# Patient Record
Sex: Female | Born: 1935 | Race: Black or African American | Hispanic: No | Smoking: Never smoker
Health system: Southern US, Community
[De-identification: ages and names within clinical notes are randomized; demographics above are authoritative.]

## PROBLEM LIST (undated history)

## (undated) DIAGNOSIS — I10 Essential (primary) hypertension: Secondary | ICD-10-CM

## (undated) DIAGNOSIS — E785 Hyperlipidemia, unspecified: Secondary | ICD-10-CM

## (undated) DIAGNOSIS — N183 Chronic kidney disease, stage 3 unspecified: Secondary | ICD-10-CM

## (undated) DIAGNOSIS — I2510 Atherosclerotic heart disease of native coronary artery without angina pectoris: Secondary | ICD-10-CM

## (undated) DIAGNOSIS — D649 Anemia, unspecified: Secondary | ICD-10-CM

## (undated) DIAGNOSIS — K219 Gastro-esophageal reflux disease without esophagitis: Secondary | ICD-10-CM

## (undated) DIAGNOSIS — Z9861 Coronary angioplasty status: Secondary | ICD-10-CM

## (undated) DIAGNOSIS — I255 Ischemic cardiomyopathy: Secondary | ICD-10-CM

## (undated) DIAGNOSIS — B029 Zoster without complications: Secondary | ICD-10-CM

## (undated) DIAGNOSIS — E1129 Type 2 diabetes mellitus with other diabetic kidney complication: Secondary | ICD-10-CM

## (undated) DIAGNOSIS — I214 Non-ST elevation (NSTEMI) myocardial infarction: Secondary | ICD-10-CM

## (undated) DIAGNOSIS — Z91041 Radiographic dye allergy status: Secondary | ICD-10-CM

## (undated) DIAGNOSIS — C259 Malignant neoplasm of pancreas, unspecified: Secondary | ICD-10-CM

## (undated) DIAGNOSIS — E669 Obesity, unspecified: Secondary | ICD-10-CM

## (undated) HISTORY — PX: ABDOMINAL HYSTERECTOMY: SHX81

## (undated) HISTORY — PX: CARDIAC CATHETERIZATION: SHX172

## (undated) HISTORY — DX: Malignant neoplasm of pancreas, unspecified: C25.9

## (undated) HISTORY — PX: CHOLECYSTECTOMY: SHX55

---

## 2014-01-06 DIAGNOSIS — I214 Non-ST elevation (NSTEMI) myocardial infarction: Secondary | ICD-10-CM

## 2014-01-06 DIAGNOSIS — I255 Ischemic cardiomyopathy: Secondary | ICD-10-CM

## 2014-01-06 DIAGNOSIS — Z91041 Radiographic dye allergy status: Secondary | ICD-10-CM

## 2014-01-06 HISTORY — DX: Ischemic cardiomyopathy: I25.5

## 2014-01-06 HISTORY — DX: Radiographic dye allergy status: Z91.041

## 2014-01-06 HISTORY — DX: Non-ST elevation (NSTEMI) myocardial infarction: I21.4

## 2014-01-15 DIAGNOSIS — N179 Acute kidney failure, unspecified: Secondary | ICD-10-CM | POA: Insufficient documentation

## 2014-01-15 DIAGNOSIS — I2510 Atherosclerotic heart disease of native coronary artery without angina pectoris: Secondary | ICD-10-CM | POA: Insufficient documentation

## 2014-01-15 DIAGNOSIS — K219 Gastro-esophageal reflux disease without esophagitis: Secondary | ICD-10-CM | POA: Insufficient documentation

## 2014-01-15 DIAGNOSIS — E785 Hyperlipidemia, unspecified: Secondary | ICD-10-CM | POA: Insufficient documentation

## 2014-09-07 ENCOUNTER — Inpatient Hospital Stay (HOSPITAL_COMMUNITY)
Admission: EM | Admit: 2014-09-07 | Discharge: 2014-09-08 | DRG: 812 | Disposition: A | Discharge status: Home / Self Care | Payer: Medicare Other | Attending: Internal Medicine | Admitting: Internal Medicine

## 2014-09-07 ENCOUNTER — Encounter (HOSPITAL_COMMUNITY): Payer: Self-pay

## 2014-09-07 DIAGNOSIS — I252 Old myocardial infarction: Secondary | ICD-10-CM | POA: Diagnosis not present

## 2014-09-07 DIAGNOSIS — K219 Gastro-esophageal reflux disease without esophagitis: Secondary | ICD-10-CM | POA: Diagnosis present

## 2014-09-07 DIAGNOSIS — E669 Obesity, unspecified: Secondary | ICD-10-CM | POA: Diagnosis present

## 2014-09-07 DIAGNOSIS — Z833 Family history of diabetes mellitus: Secondary | ICD-10-CM

## 2014-09-07 DIAGNOSIS — Z9861 Coronary angioplasty status: Secondary | ICD-10-CM

## 2014-09-07 DIAGNOSIS — K922 Gastrointestinal hemorrhage, unspecified: Secondary | ICD-10-CM | POA: Diagnosis present

## 2014-09-07 DIAGNOSIS — Z955 Presence of coronary angioplasty implant and graft: Secondary | ICD-10-CM

## 2014-09-07 DIAGNOSIS — I129 Hypertensive chronic kidney disease with stage 1 through stage 4 chronic kidney disease, or unspecified chronic kidney disease: Secondary | ICD-10-CM | POA: Diagnosis present

## 2014-09-07 DIAGNOSIS — Z66 Do not resuscitate: Secondary | ICD-10-CM | POA: Diagnosis present

## 2014-09-07 DIAGNOSIS — D62 Acute posthemorrhagic anemia: Secondary | ICD-10-CM | POA: Diagnosis present

## 2014-09-07 DIAGNOSIS — Z794 Long term (current) use of insulin: Secondary | ICD-10-CM | POA: Diagnosis not present

## 2014-09-07 DIAGNOSIS — N183 Chronic kidney disease, stage 3 unspecified: Secondary | ICD-10-CM | POA: Diagnosis present

## 2014-09-07 DIAGNOSIS — N189 Chronic kidney disease, unspecified: Secondary | ICD-10-CM | POA: Diagnosis not present

## 2014-09-07 DIAGNOSIS — I2510 Atherosclerotic heart disease of native coronary artery without angina pectoris: Secondary | ICD-10-CM | POA: Diagnosis present

## 2014-09-07 DIAGNOSIS — Z91041 Radiographic dye allergy status: Secondary | ICD-10-CM | POA: Diagnosis present

## 2014-09-07 DIAGNOSIS — Z7982 Long term (current) use of aspirin: Secondary | ICD-10-CM | POA: Diagnosis not present

## 2014-09-07 DIAGNOSIS — I10 Essential (primary) hypertension: Secondary | ICD-10-CM | POA: Diagnosis not present

## 2014-09-07 DIAGNOSIS — E118 Type 2 diabetes mellitus with unspecified complications: Secondary | ICD-10-CM

## 2014-09-07 DIAGNOSIS — E785 Hyperlipidemia, unspecified: Secondary | ICD-10-CM | POA: Diagnosis present

## 2014-09-07 DIAGNOSIS — D6489 Other specified anemias: Secondary | ICD-10-CM | POA: Diagnosis not present

## 2014-09-07 DIAGNOSIS — R195 Other fecal abnormalities: Secondary | ICD-10-CM | POA: Diagnosis present

## 2014-09-07 DIAGNOSIS — R531 Weakness: Secondary | ICD-10-CM | POA: Diagnosis present

## 2014-09-07 DIAGNOSIS — E1129 Type 2 diabetes mellitus with other diabetic kidney complication: Secondary | ICD-10-CM | POA: Diagnosis present

## 2014-09-07 DIAGNOSIS — Z7902 Long term (current) use of antithrombotics/antiplatelets: Secondary | ICD-10-CM | POA: Diagnosis not present

## 2014-09-07 DIAGNOSIS — D509 Iron deficiency anemia, unspecified: Secondary | ICD-10-CM | POA: Diagnosis present

## 2014-09-07 DIAGNOSIS — D649 Anemia, unspecified: Secondary | ICD-10-CM | POA: Insufficient documentation

## 2014-09-07 DIAGNOSIS — E1122 Type 2 diabetes mellitus with diabetic chronic kidney disease: Secondary | ICD-10-CM | POA: Diagnosis present

## 2014-09-07 DIAGNOSIS — Z6837 Body mass index (BMI) 37.0-37.9, adult: Secondary | ICD-10-CM | POA: Diagnosis not present

## 2014-09-07 DIAGNOSIS — Z823 Family history of stroke: Secondary | ICD-10-CM | POA: Diagnosis not present

## 2014-09-07 HISTORY — DX: Essential (primary) hypertension: I10

## 2014-09-07 HISTORY — DX: Non-ST elevation (NSTEMI) myocardial infarction: I21.4

## 2014-09-07 HISTORY — DX: Atherosclerotic heart disease of native coronary artery without angina pectoris: I25.10

## 2014-09-07 HISTORY — DX: Hyperlipidemia, unspecified: E78.5

## 2014-09-07 HISTORY — DX: Obesity, unspecified: E66.9

## 2014-09-07 HISTORY — DX: Chronic kidney disease, stage 3 (moderate): N18.3

## 2014-09-07 HISTORY — DX: Coronary angioplasty status: Z98.61

## 2014-09-07 HISTORY — DX: Gastro-esophageal reflux disease without esophagitis: K21.9

## 2014-09-07 HISTORY — DX: Zoster without complications: B02.9

## 2014-09-07 HISTORY — DX: Anemia, unspecified: D64.9

## 2014-09-07 HISTORY — DX: Chronic kidney disease, stage 3 unspecified: N18.30

## 2014-09-07 HISTORY — DX: Radiographic dye allergy status: Z91.041

## 2014-09-07 HISTORY — DX: Ischemic cardiomyopathy: I25.5

## 2014-09-07 HISTORY — DX: Type 2 diabetes mellitus with other diabetic kidney complication: E11.29

## 2014-09-07 LAB — CBC WITH DIFFERENTIAL/PLATELET
BASOS ABS: 0.1 10*3/uL (ref 0.0–0.1)
Basophils Relative: 1 % (ref 0–1)
EOS PCT: 6 % — AB (ref 0–5)
Eosinophils Absolute: 0.3 10*3/uL (ref 0.0–0.7)
HCT: 23.4 % — ABNORMAL LOW (ref 36.0–46.0)
HEMOGLOBIN: 7.3 g/dL — AB (ref 12.0–15.0)
Lymphocytes Relative: 17 % (ref 12–46)
Lymphs Abs: 1 10*3/uL (ref 0.7–4.0)
MCH: 27 pg (ref 26.0–34.0)
MCHC: 31.2 g/dL (ref 30.0–36.0)
MCV: 86.7 fL (ref 78.0–100.0)
MONOS PCT: 6 % (ref 3–12)
Monocytes Absolute: 0.4 10*3/uL (ref 0.1–1.0)
Neutro Abs: 4 10*3/uL (ref 1.7–7.7)
Neutrophils Relative %: 70 % (ref 43–77)
PLATELETS: 222 10*3/uL (ref 150–400)
RBC: 2.7 MIL/uL — ABNORMAL LOW (ref 3.87–5.11)
RDW: 15.1 % (ref 11.5–15.5)
WBC: 5.7 10*3/uL (ref 4.0–10.5)

## 2014-09-07 LAB — BASIC METABOLIC PANEL
Anion gap: 7 (ref 5–15)
BUN: 28 mg/dL — ABNORMAL HIGH (ref 6–20)
CO2: 25 mmol/L (ref 22–32)
Calcium: 8.4 mg/dL — ABNORMAL LOW (ref 8.9–10.3)
Chloride: 105 mmol/L (ref 101–111)
Creatinine, Ser: 1.16 mg/dL — ABNORMAL HIGH (ref 0.44–1.00)
GFR calc Af Amer: 50 mL/min — ABNORMAL LOW (ref >60)
GFR calc non Af Amer: 43 mL/min — ABNORMAL LOW (ref >60)
Glucose, Bld: 162 mg/dL — ABNORMAL HIGH (ref 65–99)
Potassium: 3.9 mmol/L (ref 3.5–5.1)
SODIUM: 137 mmol/L (ref 135–145)

## 2014-09-07 LAB — IRON AND TIBC
IRON: 35 ug/dL (ref 28–170)
SATURATION RATIOS: 7 % — AB (ref 10.4–31.8)
TIBC: 472 ug/dL — AB (ref 250–450)
UIBC: 437 ug/dL

## 2014-09-07 LAB — PREPARE RBC (CROSSMATCH)

## 2014-09-07 LAB — GLUCOSE, CAPILLARY
Glucose-Capillary: 49 mg/dL — ABNORMAL LOW (ref 65–99)
Glucose-Capillary: 79 mg/dL (ref 65–99)
Glucose-Capillary: 184 mg/dL — ABNORMAL HIGH (ref 65–99)

## 2014-09-07 LAB — ABO/RH: ABO/RH(D): A POS

## 2014-09-07 LAB — VITAMIN B12: VITAMIN B 12: 540 pg/mL (ref 180–914)

## 2014-09-07 LAB — RETICULOCYTES
RBC.: 2.73 MIL/uL — AB (ref 3.87–5.11)
Retic Count, Absolute: 57.3 10*3/uL (ref 19.0–186.0)
Retic Ct Pct: 2.1 % (ref 0.4–3.1)

## 2014-09-07 LAB — PROTIME-INR
INR: 1.07 (ref 0.00–1.49)
Prothrombin Time: 14.1 seconds (ref 11.6–15.2)

## 2014-09-07 LAB — OCCULT BLOOD, POC DEVICE: Fecal Occult Bld: POSITIVE — AB

## 2014-09-07 LAB — TSH: TSH: 1.586 u[IU]/mL (ref 0.350–4.500)

## 2014-09-07 LAB — FERRITIN: FERRITIN: 10 ng/mL — AB (ref 11–307)

## 2014-09-07 MED ORDER — MECLIZINE HCL 12.5 MG PO TABS: 25.0000 mg | ORAL_TABLET | Freq: Three times a day (TID) | ORAL | Status: DC | PRN

## 2014-09-07 MED ORDER — INSULIN DETEMIR 100 UNIT/ML ~~LOC~~ SOLN
22.0000 [IU] | Freq: Every day | SUBCUTANEOUS | Status: DC
  Administered 2014-09-08: 22 [IU] via SUBCUTANEOUS
  Filled 2014-09-07 (×3): qty 0.22

## 2014-09-07 MED ORDER — SODIUM CHLORIDE 0.9 % IV SOLN
Freq: Once | INTRAVENOUS | Status: AC
  Administered 2014-09-07: 1 mL via INTRAVENOUS

## 2014-09-07 MED ORDER — ACETAMINOPHEN 325 MG PO TABS: 650.0000 mg | ORAL_TABLET | Freq: Four times a day (QID) | ORAL | Status: DC | PRN

## 2014-09-07 MED ORDER — ASPIRIN EC 81 MG PO TBEC
81.0000 mg | DELAYED_RELEASE_TABLET | Freq: Every day | ORAL | Status: DC
  Administered 2014-09-08: 81 mg via ORAL
  Filled 2014-09-07: qty 1

## 2014-09-07 MED ORDER — ACETAMINOPHEN 650 MG RE SUPP: 650.0000 mg | Freq: Four times a day (QID) | RECTAL | Status: DC | PRN

## 2014-09-07 MED ORDER — SODIUM CHLORIDE 0.9 % IJ SOLN
3.0000 mL | Freq: Two times a day (BID) | INTRAMUSCULAR | Status: DC
  Administered 2014-09-07 – 2014-09-08 (×2): 3 mL via INTRAVENOUS

## 2014-09-07 MED ORDER — INSULIN ASPART 100 UNIT/ML ~~LOC~~ SOLN
0.0000 [IU] | Freq: Three times a day (TID) | SUBCUTANEOUS | Status: DC
Start: 2014-09-07 — End: 2014-09-08
  Administered 2014-09-08: 5 [IU] via SUBCUTANEOUS

## 2014-09-07 MED ORDER — SODIUM CHLORIDE 0.9 % IV SOLN
INTRAVENOUS | Status: DC
  Administered 2014-09-07: 16:00:00 via INTRAVENOUS

## 2014-09-07 MED ORDER — ONDANSETRON HCL 4 MG/2ML IJ SOLN: 4.0000 mg | Freq: Four times a day (QID) | INTRAMUSCULAR | Status: DC | PRN

## 2014-09-07 MED ORDER — PANTOPRAZOLE SODIUM 40 MG PO TBEC
40.0000 mg | DELAYED_RELEASE_TABLET | Freq: Two times a day (BID) | ORAL | Status: DC
  Administered 2014-09-08 (×2): 40 mg via ORAL
  Filled 2014-09-07 (×2): qty 1

## 2014-09-07 MED ORDER — TICAGRELOR 90 MG PO TABS
90.0000 mg | ORAL_TABLET | Freq: Two times a day (BID) | ORAL | Status: DC
  Administered 2014-09-08 (×2): 90 mg via ORAL
  Filled 2014-09-07 (×4): qty 1

## 2014-09-07 MED ORDER — INSULIN ASPART 100 UNIT/ML ~~LOC~~ SOLN: 0.0000 [IU] | Freq: Every day | SUBCUTANEOUS | Status: DC

## 2014-09-07 MED ORDER — GABAPENTIN 300 MG PO CAPS: 300.0000 mg | ORAL_CAPSULE | Freq: Every evening | ORAL | Status: DC

## 2014-09-07 MED ORDER — METOPROLOL SUCCINATE ER 25 MG PO TB24
25.0000 mg | ORAL_TABLET | Freq: Every day | ORAL | Status: DC
  Administered 2014-09-08: 25 mg via ORAL
  Filled 2014-09-07: qty 1

## 2014-09-07 MED ORDER — INSULIN DETEMIR 100 UNIT/ML FLEXPEN: 22.0000 [IU] | PEN_INJECTOR | Freq: Every day | SUBCUTANEOUS | Status: DC

## 2014-09-07 MED ORDER — ONDANSETRON HCL 4 MG PO TABS: 4.0000 mg | ORAL_TABLET | Freq: Four times a day (QID) | ORAL | Status: DC | PRN

## 2014-09-07 NOTE — H&P (Signed)
[facility] History and Physical  Inioluwa Boulay ENI:[id number] DOB: Aug 03, 1931 DOA: 09/07/2014  Referring physician: Dr. Jeneen Rinks ER PCP: No primary care provider on file.   Chief Complaint: Weakness  HPI: Kelsey Palmer is a 81 y.o. female  Patient had routine blood work done on 7/29.  Today she was called by her Dr. office and oold to come to the ED for low hemoglobin. Patient is an 81 y/o female who has been feeling weak and tired.  This has been ongoing for about a week or a week and half.  Pt denies trouble breathing unless she is in the heat.  Denies dizziness, CP, v/n, dysuria, melena or hematochezia, abdominal pain.  States BM are fairly normal.  Previous MI in December 2015. Denies use of NSAIDS. Denies tobacco use, ETOH use.    Review of Systems:  Constitutional:  No weight loss, night sweats, Fevers, chills, fatigue.  HEENT:  No headaches, Difficulty swallowing,Tooth/dental problems,Sore throat,  No sneezing, itching, ear ache, nasal congestion, post nasal drip,  Cardio-vascular:  No chest pain, Orthopnea, PND, swelling in lower extremities, anasarca, dizziness, palpitations  GI:  No heartburn, indigestion, abdominal pain, nausea, vomiting, diarrhea, change in bowel habits, loss of appetite  Resp:  No shortness of breath with exertion or at rest. No excess mucus, no productive cough, No non-productive cough, No coughing up of blood.No change in color of mucus.No wheezing.No chest wall deformity  Skin:  no rash or lesions.  GU:  no dysuria, change in color of urine, no urgency or frequency. No flank pain.  Musculoskeletal:  No joint pain or swelling. No decreased range of motion. No back pain.  Psych:  No change in mood or affect. No depression or anxiety. No memory loss.   Past Medical History  Diagnosis Date  . Hypertension   . MI (myocardial infarction) Dec 2015  . Diabetes mellitus without complication   . GERD (gastroesophageal reflux disease)   . Shingles     Past Surgical History  Procedure Laterality Date  . Cardiac catheterization  Dec 2015/Jan 2016    First procedure had allergic reaction to dye, had to do again in Jan 2016  . Abdominal hysterectomy    . Cholecystectomy     Social History:  reports that she has never smoked. She has never used smokeless tobacco. She reports that she does not drink alcohol or use illicit drugs.  Allergies  Allergen Reactions  . Statins Other (See Comments)    "Attacks the muscles"  . Tomato Hives  . Contrast Media [Iodinated Diagnostic Agents] Rash   Family History Mother - DM Father - CVA Siblings - MI  Prior to Admission medications   Medication Sig Start Date End Date Taking? Authorizing Provider  amLODipine (NORVASC) 5 MG tablet Take 1 tablet by mouth daily. 06/22/14  Yes Historical Provider, MD  Artificial Tear Ointment (DRY EYES OP) Apply 2 drops to eye daily as needed (dry eyes).   Yes Historical Provider, MD  aspirin EC 81 MG tablet Take 81 mg by mouth daily.   Yes Historical Provider, MD  BRILINTA 90 MG TABS tablet Take 1 tablet by mouth 2 (two) times daily. 08/20/14  Yes Historical Provider, MD  clobetasol ointment (TEMOVATE) 6.14 % Apply 1 application topically daily as needed (rash).  07/14/14  Yes Historical Provider, MD  furosemide (LASIX) 40 MG tablet Take 1 tablet by mouth daily. 08/20/14  Yes Historical Provider, MD  gabapentin (NEURONTIN) 300 MG capsule Take 1 capsule by mouth every  evening.  08/04/14  Yes Historical Provider, MD  insulin aspart (NOVOLOG) 100 UNIT/ML injection Inject 5-7 Units into the skin 3 (three) times daily before meals. Per sliding scale   Yes Historical Provider, MD  LEVEMIR FLEXTOUCH 100 UNIT/ML Pen Inject 22 Units as directed daily. 06/22/14  Yes Historical Provider, MD  losartan (COZAAR) 50 MG tablet Take 1 tablet by mouth daily. 06/22/14  Yes Historical Provider, MD  meclizine (ANTIVERT) 25 MG tablet Take 25 mg by mouth 3 (three) times daily as needed  (veritgo).   Yes Historical Provider, MD  metoprolol succinate (TOPROL-XL) 25 MG 24 hr tablet Take 1 tablet by mouth daily. 07/13/14  Yes Historical Provider, MD  omeprazole (PRILOSEC) 20 MG capsule Take 1 capsule by mouth daily. 08/20/14  Yes Historical Provider, MD  pioglitazone (ACTOS) 30 MG tablet Take 1 tablet by mouth daily. 08/06/14  Yes Historical Provider, MD   Physical Exam: Filed Vitals:   09/07/14 0933 09/07/14 0936 09/07/14 1044 09/07/14 1241  BP:  155/40 121/40 126/48  Pulse:  75 75 62  Temp: 98 F (36.7 C) 98 F (36.7 C) 98 F (36.7 C) 98.1 F (36.7 C)  TempSrc: Oral Oral  Oral  Resp:  18 18 20   Height: 5' (1.524 m) 5' (1.524 m)    Weight: 85.787 kg (189 lb 2 oz) 85.73 kg (189 lb)    SpO2:  100% 98% 96%    Wt Readings from Last 3 Encounters:  09/07/14 85.73 kg (189 lb)    General:  Appears calm and comfortable, resting in bed, NAD Eyes: PERRL, normal lids, irises & conjunctiva ENT: grossly normal hearing, lips & tongue Neck: no LAD, masses or thyromegaly Cardiovascular: RRR, no m/r/g. No LE edema. Telemetry: SR, no arrhythmias  Respiratory: CTA bilaterally, no w/r/r. Normal respiratory effort. Abdomen: soft, ntnd Skin: no rash or induration seen on limited exam Musculoskeletal: Chronic left trace edema LE Psychiatric: grossly normal mood and affect, speech fluent and appropriate Neurologic: grossly non-focal.          Labs on Admission:  Basic Metabolic Panel:  Recent Labs Lab 09/07/14 0956  NA 137  K 3.9  CL 105  CO2 25  GLUCOSE 162*  BUN 28*  CREATININE 1.16*  CALCIUM 8.4*   Liver Function Tests: No results for input(s): AST, ALT, ALKPHOS, BILITOT, PROT, ALBUMIN in the last 168 hours. No results for input(s): LIPASE, AMYLASE in the last 168 hours. No results for input(s): AMMONIA in the last 168 hours. CBC:  Recent Labs Lab 09/07/14 0956  WBC 5.7  NEUTROABS 4.0  HGB 7.3*  HCT 23.4*  MCV 86.7  PLT 222   Cardiac Enzymes: No results  for input(s): CKTOTAL, CKMB, CKMBINDEX, TROPONINI in the last 168 hours.  BNP (last 3 results) No results for input(s): BNP in the last 8760 hours.  ProBNP (last 3 results) No results for input(s): PROBNP in the last 8760 hours.  CBG: No results for input(s): GLUCAP in the last 168 hours.  Radiological Exams on Admission: No results found.   Assessment/Plan Active Problems:   Diabetes mellitus   Essential hypertension   GI bleed   Acute blood loss anemia   CAD (coronary artery disease)   UGIB (upper gastrointestinal bleed)   1. GI bleeding. Heme positive on rectal exam, no signs of gross melena or hematochezia.  She is not on any NSAIDs but does take Asprin and brilinta.  Will request Gi consult. She reports that last colonoscopy was 6 years ago  and per patient, was normal. Continue on PPI.  She is hemodynamically stable 2. Acute blood loss anemia. Baseline hemoglobin appears to be around 9, current hemoglobin is 7.3. She does not have any CP, SOB or evidence of ACS.  Will continue to monitor hemoglobin.  Transfuse for hemoglobin less than 7. 3. Coronary Artery Disease. Patient recently had stents placed, December 2015/January 2016.Will continue antiplatelets for now. She does not have any chest pain at this time.  4. Hypertension. Blood pressure is stable. Continue BB, but hold norvasc, losartan lasix 5. DM. Hold oral hypoglycemics.  Start sliding scale insulin and continue basal insulin  Consultations  GI   Code Status: DNR DVT Prophylaxis: SCD Family Communication: Patient is by herself.  Discussed care plan in detail with pt and she understands.  There are no questions at this time. Disposition Plan: 1 -2 days, home once imrpoved  Time spent: 55 minutes  Kathie Dike, M.D [facility] Pager [phone]  I, Salvadore Oxford, acting a scribe, recorded this note contemporaneously in the presence of Dr. Kathie Dike, M.D. On 09/07/2014 at 12:46 PM

## 2014-09-07 NOTE — ED Notes (Signed)
Doctors office called this morning to inform pt to go to the ED for eval due to low hemoglobin

## 2014-09-07 NOTE — Consult Note (Signed)
Referring Provider: Dr. Roderic Palau Primary Care Physician:  No primary care provider on file. Primary Gastroenterologist:  Dr. Oneida Alar   Date of Admission: 09/07/14 Date of Consultation: 09/07/14  Reason for Consultation:  Anemia, heme positive stool   HPI:  Kelsey Palmer is an 81 y.o. year old female presenting to the ED with anemia, reporting fatigue. Recently had bloodwork through PCP who asked her to present for medical attention. States she has felt fatigued since Jan 2016. Had MI in December 2015 with catheterization in Dec/Jan. On Brilinta and aspirin. Jan 2016, Hgb was 8.9. Admitting Hgb today 7.3. Denies any melena or hematochezia. No changes in bowel habits. Minor constipation intermittently related to "what I eat". Fair appetite, stating she has had waxing and waning appetite and energy since Jan 2016. No upper GI symptoms. No dysphagia, reflux exacerbations, abdominal pain. No nausea or vomiting. No unexplained weight loss.   Last colonoscopy approximately 6 years ago in [place], normal per her report. No prior EGD.   Past Medical History  Diagnosis Date  . Hypertension   . MI (myocardial infarction) Dec 2015  . Diabetes mellitus without complication   . GERD (gastroesophageal reflux disease)   . Shingles     Past Surgical History  Procedure Laterality Date  . Cardiac catheterization  Dec 2015/Jan 2016    First procedure had allergic reaction to dye, had to do again in Jan 2016  . Abdominal hysterectomy    . Cholecystectomy      Prior to Admission medications   Medication Sig Start Date End Date Taking? Authorizing Provider  amLODipine (NORVASC) 5 MG tablet Take 1 tablet by mouth daily. 06/22/14  Yes Historical Provider, MD  Artificial Tear Ointment (DRY EYES OP) Apply 2 drops to eye daily as needed (dry eyes).   Yes Historical Provider, MD  aspirin EC 81 MG tablet Take 81 mg by mouth daily.   Yes Historical Provider, MD  BRILINTA 90 MG TABS tablet Take 1 tablet by mouth 2  (two) times daily. 08/20/14  Yes Historical Provider, MD  clobetasol ointment (TEMOVATE) 0.35 % Apply 1 application topically daily as needed (rash).  07/14/14  Yes Historical Provider, MD  furosemide (LASIX) 40 MG tablet Take 1 tablet by mouth daily. 08/20/14  Yes Historical Provider, MD  gabapentin (NEURONTIN) 300 MG capsule Take 1 capsule by mouth every evening.  08/04/14  Yes Historical Provider, MD  insulin aspart (NOVOLOG) 100 UNIT/ML injection Inject 5-7 Units into the skin 3 (three) times daily before meals. Per sliding scale   Yes Historical Provider, MD  LEVEMIR FLEXTOUCH 100 UNIT/ML Pen Inject 22 Units as directed daily. 06/22/14  Yes Historical Provider, MD  losartan (COZAAR) 50 MG tablet Take 1 tablet by mouth daily. 06/22/14  Yes Historical Provider, MD  meclizine (ANTIVERT) 25 MG tablet Take 25 mg by mouth 3 (three) times daily as needed (veritgo).   Yes Historical Provider, MD  metoprolol succinate (TOPROL-XL) 25 MG 24 hr tablet Take 1 tablet by mouth daily. 07/13/14  Yes Historical Provider, MD  omeprazole (PRILOSEC) 20 MG capsule Take 1 capsule by mouth daily. 08/20/14  Yes Historical Provider, MD  pioglitazone (ACTOS) 30 MG tablet Take 1 tablet by mouth daily. 08/06/14  Yes Historical Provider, MD    No current facility-administered medications for this encounter.    Allergies as of 09/07/2014 - Review Complete 09/07/2014  Allergen Reaction Noted  . Statins Other (See Comments) 09/07/2014  . Tomato Hives 09/07/2014  . Contrast media [iodinated diagnostic agents]  Rash 09/07/2014    Family History  Problem Relation Age of Onset  . Colon cancer Neg Hx     History   Social History  . Marital Status: Widowed    Spouse Name: N/A  . Number of Children: N/A  . Years of Education: N/A   Occupational History  . Not on file.   Social History Main Topics  . Smoking status: Never Smoker   . Smokeless tobacco: Never Used  . Alcohol Use: No  . Drug Use: No  . Sexual Activity: Not  on file   Other Topics Concern  . Not on file   Social History Narrative  . No narrative on file    Review of Systems: Gen: see HPI  CV: Denies chest pain, heart palpitations, syncope, edema  Resp: Denies shortness of breath with rest, cough, wheezing GI: see HPI GU : Denies urinary burning, urinary frequency, urinary incontinence.  MS: pinched nerve in back Derm: Denies rash, itching, dry skin Psych: Denies depression, anxiety,confusion, or memory loss Heme: Denies bruising, bleeding, and enlarged lymph nodes.  Physical Exam: Vital signs in last 24 hours: Temp:  [98 F (36.7 C)-98.1 F (36.7 C)] 98.1 F (36.7 C) (08/01 1241) Pulse Rate:  [62-75] 62 (08/01 1241) Resp:  [18-20] 20 (08/01 1241) BP: (121-155)/(40-48) 126/48 mmHg (08/01 1241) SpO2:  [96 %-100 %] 96 % (08/01 1241) Weight:  [189 lb (85.73 kg)-189 lb 2 oz (85.787 kg)] 189 lb (85.73 kg) (08/01 0936)   General:   Alert,  Well-developed, well-nourished, pleasant and cooperative in NAD Head:  Normocephalic and atraumatic. Eyes:  Sclera clear, no icterus.   Conjunctiva pink. Ears:  Normal auditory acuity. Nose:  No deformity, discharge,  or lesions. Mouth:  No deformity or lesions, dentition normal. Lungs:  Clear throughout to auscultation.   No wheezes, crackles, or rhonchi. No acute distress. Heart:  Regular rate and rhythm; no murmurs, clicks, rubs,  or gallops. Abdomen:  Soft, nontender and nondistended. No masses, hepatosplenomegaly. Possible small umbilical hernia. Normal bowel sounds, without guarding, and without rebound.   Rectal:  Deferred until time of colonoscopy.   Msk:  Symmetrical without gross deformities. Normal posture. Extremities:  1+ lower extremity. Neurologic:  Alert and  oriented x4;  grossly normal neurologically. Skin:  Intact without significant lesions or rashes. Psych:  Alert and cooperative. Normal mood and affect.  Intake/Output from previous day:   Intake/Output this shift:     Lab Results:  Recent Labs  09/07/14 0956  WBC 5.7  HGB 7.3*  HCT 23.4*  PLT 222   BMET  Recent Labs  09/07/14 0956  NA 137  K 3.9  CL 105  CO2 25  GLUCOSE 162*  BUN 28*  CREATININE 1.16*  CALCIUM 8.4*   PT/INR  Recent Labs  09/07/14 0956  LABPROT 14.1  INR 1.07    Impression: 81 year old female admitted with anemia and heme positive stool but without overt GI bleeding. Presentation complicated by recent MI in Dec 2015 and stent placement in Jan 2016, on Brilinta. Last colonoscopy in remote past (6 years ago per patient), which was reportedly normal. No concerning upper or lower GI symptoms. Unable to hold Brilinta currently due to recent stent placement, so any procedures would be diagnostic only, with the potential for a staged procedure if large polyp noted. As she is not overtly bleeding, may be able to favor a conservative approach. Appears she has had a chronic anemia, with Hgb ranging 8 to 9 from [facility]  records. Will obtain anemia panel for further characterization.   Plan: Anemia panel May have heart health diet Will discuss timing of any procedures with Dr. Homero Fellers, ANP-BC [facility]     LOS: 0 days    09/07/2014, 1:30 PM

## 2014-09-07 NOTE — ED Notes (Signed)
Patient ambulatory to restroom, steady gait.

## 2014-09-07 NOTE — ED Provider Notes (Signed)
CSN: [id number]     Arrival date & time 09/07/14  6834 History  This chart was scribed for Kelsey Furry, MD by Thea Alken, ED Scribe. This patient was seen in room APA18/APA18 and the patient's care was started at 9:39 AM.  Chief Complaint  Patient presents with  . Abnormal Lab   The history is provided by the patient. No language interpreter was used.    HPI Comments:  Kelsey Palmer is a 81 y.o. female who present to the Emergency Department complaining of abnormal labs. Pt received a call from her nurse practitioner, Acquanetta Belling saying her blood count was low. Her blood was drawn 3 days ago for a routine visit. States the last time she had an abnormal blood count was possibly 02-2014 when she had MI. She reports some weakness and occasional spinning dizziness due to vertigo.. Pt takes aspirin, losartan and amlodipine . She had normal colonoscopy was 3 years ago.  She has occasional swelling in left leg. She denies abdominal pain and change in color of stool.   Past Medical History  Diagnosis Date  . Hypertension   . Type 2 diabetes mellitus with renal manifestations   . GERD (gastroesophageal reflux disease)   . Shingles   . CAD S/P percutaneous coronary angioplasty 12/15- 1/16    OM3, staged LAD/RCA  . Ischemic cardiomyopathy Dec 2015    EF 45-50%  . NSTEMI (non-ST elevated myocardial infarction) DEC 2015    cath done in [place]  . Chronic renal disease, stage III   . Obesity (BMI 30-39.9)   . Dyslipidemia     statin intol  . Anemia   . Contrast media allergy Dec 2015   Past Surgical History  Procedure Laterality Date  . Cardiac catheterization  Dec 2015/Jan 2016    First procedure had allergic reaction to dye, had to do again in Jan 2016  . Abdominal hysterectomy    . Cholecystectomy     Family History  Problem Relation Age of Onset  . Colon cancer Neg Hx    History  Substance Use Topics  . Smoking status: Never Smoker   . Smokeless tobacco: Never Used  . Alcohol  Use: No   OB History    No data available     Review of Systems  Constitutional: Negative for fever, chills, diaphoresis, appetite change and fatigue.  HENT: Negative for mouth sores, sore throat and trouble swallowing.   Eyes: Negative for visual disturbance.  Respiratory: Negative for cough, chest tightness, shortness of breath and wheezing.   Cardiovascular: Positive for leg swelling. Negative for chest pain.  Gastrointestinal: Negative for nausea, vomiting, abdominal pain, diarrhea and abdominal distention.  Endocrine: Negative for polydipsia, polyphagia and polyuria.  Genitourinary: Negative for dysuria, frequency and hematuria.  Musculoskeletal: Negative for gait problem.  Skin: Negative for color change, pallor and rash.  Neurological: Positive for dizziness. Negative for syncope, light-headedness and headaches.  Hematological: Does not bruise/bleed easily.  Psychiatric/Behavioral: Negative for behavioral problems and confusion.    Allergies  Statins; Tomato; and Contrast media  Home Medications   Prior to Admission medications   Medication Sig Start Date End Date Taking? Authorizing Provider  amLODipine (NORVASC) 5 MG tablet Take 1 tablet by mouth daily. 06/22/14  Yes Historical Provider, MD  Artificial Tear Ointment (DRY EYES OP) Apply 2 drops to eye daily as needed (dry eyes).   Yes Historical Provider, MD  aspirin EC 81 MG tablet Take 81 mg by mouth daily.   Yes  Historical Provider, MD  BRILINTA 90 MG TABS tablet Take 1 tablet by mouth 2 (two) times daily. 08/20/14  Yes Historical Provider, MD  clobetasol ointment (TEMOVATE) 7.62 % Apply 1 application topically daily as needed (rash).  07/14/14  Yes Historical Provider, MD  furosemide (LASIX) 40 MG tablet Take 1 tablet by mouth daily. 08/20/14  Yes Historical Provider, MD  gabapentin (NEURONTIN) 300 MG capsule Take 1 capsule by mouth every evening.  08/04/14  Yes Historical Provider, MD  insulin aspart (NOVOLOG) 100 UNIT/ML  injection Inject 5-7 Units into the skin 3 (three) times daily before meals. Per sliding scale   Yes Historical Provider, MD  LEVEMIR FLEXTOUCH 100 UNIT/ML Pen Inject 22 Units as directed daily. 06/22/14  Yes Historical Provider, MD  losartan (COZAAR) 50 MG tablet Take 1 tablet by mouth daily. 06/22/14  Yes Historical Provider, MD  meclizine (ANTIVERT) 25 MG tablet Take 25 mg by mouth 3 (three) times daily as needed (veritgo).   Yes Historical Provider, MD  metoprolol succinate (TOPROL-XL) 25 MG 24 hr tablet Take 1 tablet by mouth daily. 07/13/14  Yes Historical Provider, MD  omeprazole (PRILOSEC) 20 MG capsule Take 1 capsule by mouth daily. 08/20/14  Yes Historical Provider, MD  pioglitazone (ACTOS) 30 MG tablet Take 1 tablet by mouth daily. 08/06/14  Yes Historical Provider, MD   BP 155/40 mmHg  Pulse 75  Temp(Src) 98 F (36.7 C) (Oral)  Resp 18  Ht 5' (1.524 m)  Wt 189 lb (85.73 kg)  BMI 36.91 kg/m2  SpO2 100% Physical Exam  Constitutional: She is oriented to person, place, and time. She appears well-developed and well-nourished. No distress.  HENT:  Head: Normocephalic.  Eyes: Conjunctivae are normal. Pupils are equal, round, and reactive to light. No scleral icterus.  Pale conjunctiva  Neck: Normal range of motion. Neck supple. No thyromegaly present.  Cardiovascular: Normal rate and regular rhythm.  Exam reveals no gallop and no friction rub.   No murmur heard. Pulmonary/Chest: Effort normal and breath sounds normal. No respiratory distress. She has no wheezes. She has no rales.  Abdominal: Soft. Bowel sounds are normal. She exhibits no distension. There is no tenderness. There is no rebound.  Musculoskeletal: Normal range of motion.  Neurological: She is alert and oriented to person, place, and time.  Skin: Skin is warm and dry. No rash noted.  Nail beds appear pale  Psychiatric: She has a normal mood and affect. Her behavior is normal.    ED Course  Procedures (including  critical care time) DIAGNOSTIC STUDIES: Oxygen Saturation is 100% on RA, normal by my interpretation.    COORDINATION OF CARE: 9:48 AM- Pt advised of plan for treatment and pt agrees.  Labs Review Labs Reviewed  CBC WITH DIFFERENTIAL/PLATELET - Abnormal; Notable for the following:    RBC 2.70 (*)    Hemoglobin 7.3 (*)    HCT 23.4 (*)    Eosinophils Relative 6 (*)    All other components within normal limits  BASIC METABOLIC PANEL - Abnormal; Notable for the following:    Glucose, Bld 162 (*)    BUN 28 (*)    Creatinine, Ser 1.16 (*)    Calcium 8.4 (*)    GFR calc non Af Amer 43 (*)    GFR calc Af Amer 50 (*)    All other components within normal limits  IRON AND TIBC - Abnormal; Notable for the following:    TIBC 472 (*)    Saturation Ratios 7 (*)  All other components within normal limits  FERRITIN - Abnormal; Notable for the following:    Ferritin 10 (*)    All other components within normal limits  RETICULOCYTES - Abnormal; Notable for the following:    RBC. 2.73 (*)    All other components within normal limits  OCCULT BLOOD X 1 CARD TO LAB, STOOL - Abnormal; Notable for the following:    Fecal Occult Bld POSITIVE (*)    All other components within normal limits  GLUCOSE, CAPILLARY - Abnormal; Notable for the following:    Glucose-Capillary 49 (*)    All other components within normal limits  GLUCOSE, CAPILLARY - Abnormal; Notable for the following:    Glucose-Capillary 184 (*)    All other components within normal limits  CBC WITH DIFFERENTIAL/PLATELET - Abnormal; Notable for the following:    RBC 3.57 (*)    Hemoglobin 9.9 (*)    HCT 30.9 (*)    Lymphocytes Relative 11 (*)    All other components within normal limits  GLUCOSE, CAPILLARY - Abnormal; Notable for the following:    Glucose-Capillary 120 (*)    All other components within normal limits  GLUCOSE, CAPILLARY - Abnormal; Notable for the following:    Glucose-Capillary 201 (*)    All other components  within normal limits  OCCULT BLOOD, POC DEVICE - Abnormal; Notable for the following:    Fecal Occult Bld POSITIVE (*)    All other components within normal limits  PROTIME-INR  VITAMIN B12  TSH  GLUCOSE, CAPILLARY  FOLATE  TYPE AND SCREEN  PREPARE RBC (CROSSMATCH)  ABO/RH    Imaging Review No results found.   EKG Interpretation None      MDM   Final diagnoses:  Anemia, unspecified anemia type  UGIB (upper gastrointestinal bleed)    I personally performed the services described in this documentation, which was scribed in my presence. The recorded information has been reviewed and is accurate.   Review of records from [facility] admit 12/15-1/16 show Hb of 8.9-9.4. Repeat Hb confirms 7.3.  Occult testing of stool is guaiac +.  Pt c/o weakness, but is otherwise minimally symptomatic.  Atnicoagulated with ASA and Brilinta.    Kelsey Furry, MD 09/11/14 [phone]

## 2014-09-08 ENCOUNTER — Other Ambulatory Visit: Payer: Self-pay

## 2014-09-08 ENCOUNTER — Telehealth: Payer: Self-pay | Admitting: Gastroenterology

## 2014-09-08 ENCOUNTER — Encounter (HOSPITAL_COMMUNITY): Payer: Self-pay | Admitting: Cardiology

## 2014-09-08 ENCOUNTER — Encounter: Payer: Self-pay | Admitting: Gastroenterology

## 2014-09-08 DIAGNOSIS — N2889 Other specified disorders of kidney and ureter: Secondary | ICD-10-CM | POA: Diagnosis present

## 2014-09-08 DIAGNOSIS — I2510 Atherosclerotic heart disease of native coronary artery without angina pectoris: Secondary | ICD-10-CM

## 2014-09-08 DIAGNOSIS — N189 Chronic kidney disease, unspecified: Secondary | ICD-10-CM

## 2014-09-08 DIAGNOSIS — D649 Anemia, unspecified: Secondary | ICD-10-CM

## 2014-09-08 DIAGNOSIS — E1129 Type 2 diabetes mellitus with other diabetic kidney complication: Secondary | ICD-10-CM

## 2014-09-08 DIAGNOSIS — Z9861 Coronary angioplasty status: Secondary | ICD-10-CM

## 2014-09-08 DIAGNOSIS — N183 Chronic kidney disease, stage 3 unspecified: Secondary | ICD-10-CM | POA: Diagnosis present

## 2014-09-08 DIAGNOSIS — D509 Iron deficiency anemia, unspecified: Secondary | ICD-10-CM

## 2014-09-08 DIAGNOSIS — E669 Obesity, unspecified: Secondary | ICD-10-CM | POA: Diagnosis present

## 2014-09-08 DIAGNOSIS — D6489 Other specified anemias: Secondary | ICD-10-CM

## 2014-09-08 DIAGNOSIS — E785 Hyperlipidemia, unspecified: Secondary | ICD-10-CM | POA: Diagnosis present

## 2014-09-08 DIAGNOSIS — Z91041 Radiographic dye allergy status: Secondary | ICD-10-CM | POA: Diagnosis present

## 2014-09-08 LAB — CBC WITH DIFFERENTIAL/PLATELET
Basophils Absolute: 0 10*3/uL (ref 0.0–0.1)
Basophils Relative: 0 % (ref 0–1)
EOS PCT: 4 % (ref 0–5)
Eosinophils Absolute: 0.4 10*3/uL (ref 0.0–0.7)
HCT: 30.9 % — ABNORMAL LOW (ref 36.0–46.0)
Hemoglobin: 9.9 g/dL — ABNORMAL LOW (ref 12.0–15.0)
LYMPHS PCT: 11 % — AB (ref 12–46)
Lymphs Abs: 0.9 10*3/uL (ref 0.7–4.0)
MCH: 27.7 pg (ref 26.0–34.0)
MCHC: 32 g/dL (ref 30.0–36.0)
MCV: 86.6 fL (ref 78.0–100.0)
Monocytes Absolute: 0.6 10*3/uL (ref 0.1–1.0)
Monocytes Relative: 7 % (ref 3–12)
NEUTROS ABS: 6.3 10*3/uL (ref 1.7–7.7)
Neutrophils Relative %: 77 % (ref 43–77)
Platelets: 209 10*3/uL (ref 150–400)
RBC: 3.57 MIL/uL — ABNORMAL LOW (ref 3.87–5.11)
RDW: 14.7 % (ref 11.5–15.5)
WBC: 8.1 10*3/uL (ref 4.0–10.5)

## 2014-09-08 LAB — GLUCOSE, CAPILLARY
Glucose-Capillary: 120 mg/dL — ABNORMAL HIGH (ref 65–99)
Glucose-Capillary: 201 mg/dL — ABNORMAL HIGH (ref 65–99)

## 2014-09-08 LAB — FOLATE: Folate: 32.1 ng/mL (ref >5.9)

## 2014-09-08 LAB — OCCULT BLOOD X 1 CARD TO LAB, STOOL: Fecal Occult Bld: POSITIVE — AB

## 2014-09-08 NOTE — Care Management Note (Signed)
Case Management Note  Patient Details  Name: Kelsey Palmer MRN: [id number] Date of Birth: September 08, 1931  Subjective/Objective:                  Pt admitted from home with anemia. Pt lives with her brother and will return home at discharge. Pt has a cane that she uses prn but stated that she is fairly independent with ADL's.  Action/Plan: Pt is for discharge today. No CM needs noted.  Expected Discharge Date:  09/09/14               Expected Discharge Plan:  Home/Self Care  In-House Referral:  NA  Discharge planning Services  CM Consult  Post Acute Care Choice:  NA Choice offered to:  NA  DME Arranged:    DME Agency:     HH Arranged:    HH Agency:     Status of Service:  Completed, signed off  Medicare Important Message Given:    Date Medicare IM Given:    Medicare IM give by:    Date Additional Medicare IM Given:    Additional Medicare Important Message give by:     If discussed at [place] of Stay Meetings, dates discussed:    Additional Comments:  Joylene Draft, RN 09/08/2014, 12:33 PM

## 2014-09-08 NOTE — Discharge Planning (Signed)
Pt IV and tele removed. Discharge papers given, explained and educated. Pt told of needed FU appts. VSS and RN assessment revealed stability for discharge. Pt will be wheeled to car and taken home via car by family.

## 2014-09-08 NOTE — Care Management Important Message (Signed)
Important Message  Patient Details  Name: Kelsey Palmer MRN: [id number] Date of Birth: 1931/10/10   Medicare Important Message Given:  N/A - LOS <3 / Initial given by admissions    Joylene Draft, RN 09/08/2014, 12:35 PM

## 2014-09-08 NOTE — Discharge Planning (Signed)
Pt waiting to ride to go home. Family should arrive about 1500 or a little earlier.  Pt will be discharged at that time.

## 2014-09-08 NOTE — Discharge Summary (Addendum)
Physician Discharge Summary  Kelsey Palmer IRC:[id number] DOB: [birth year] DOA: 09/07/2014  PCP: No primary care provider on file.  Admit date: 09/07/2014 Discharge date: 09/08/2014  Time spent: 40 minutes  Recommendations for Outpatient Follow-up:  1. Follow up with GI as outpatient, will repeat CBC in one month 2. Continue Asprin and Brilinta as outpatient   Discharge Diagnoses:  Active Problems:   Type 2 diabetes mellitus with renal manifestations   Essential hypertension   CAD S/P DES Dec 2015- Jan 2016   Heme positive stool   Anemia   Obesity BMI 37   Dyslipidemia-statin intol   Chronic renal insufficiency, stage III (moderate)   Contrast media allergy   Discharge Condition: Improved  Diet recommendation: Heart healthy  Filed Weights   09/07/14 0933 09/07/14 0936 09/07/14 1431  Weight: 85.787 kg (189 lb 2 oz) 85.73 kg (189 lb) 85.412 kg (188 lb 4.8 oz)    History of present illness:   Patient had routine blood work done on 7/29. Today she was called by her Dr. office and oold to come to the ED for low hemoglobin. Patient is an 81 y/o female who has been feeling weak and tired. This has been ongoing for about a week or a week and half. Pt denies trouble breathing unless she is in the heat. Denies dizziness, CP, v/n, dysuria, melena or hematochezia, abdominal pain. States BM are fairly normal. Previous MI in December 2015. Denies use of NSAIDS. Denies tobacco use, ETOH use.   Hospital Course:  Patient admitted to hospital for acute blood loss anemia. Hemoglobin on admission was 7.3 was transfused two units of PBRC on 8/1. Today her Hemoglobin is 9.9. Baseline hemoglobin appears to be around 9. On admission she did not have any evidence of ACS and she was asymptomatic.    1.  GI bleeding. Heme positive on rectal exam, no signs of gross melena or hematochezia. She is not on any NSAIDs but does take Asprin and brilinta. GI appreciated, will follow up as outpatient, no urgent  endoscopy/colonscopy needed at this time.. She reports that last colonoscopy was 6 years ago and per patient, was normal. On PPI. She is hemodynamically stable  2. Coronary Artery Disease. Patient recently had stents placed, December 2015/January 2016. Seen by cardiology and recommended to continue antiplatelet therapy since she has had recent stents and there are concerns for development of late stent thrombosis. . She continues to not have any chest pain at this time.  3. Hypertension. Blood pressure remains stable. Will resume antihypertensives on discharge 4. DM. Blood sugars stable, continue outpatient regimen  Procedures:    Consultations:  GI  Cardiology  Discharge Exam: Filed Vitals:   09/08/14 1140  BP: 148/52  Pulse: 68  Temp:   Resp:      General: Appears calm and comfortable, sitting in chair comfortably  Cardiovascular: Regular rate and rhythm, no murmurs, rubs or gallops  Respiratory: clear to auscultation bilaterally , no work of breathing noted  Abdomen: soft, no tenderness, no distension  Musculoskeletal: Pedal pulses 2+, no peripheral edema LE   Discharge Instructions    Current Discharge Medication List    CONTINUE these medications which have NOT CHANGED   Details  amLODipine (NORVASC) 5 MG tablet Take 1 tablet by mouth daily.    Artificial Tear Ointment (DRY EYES OP) Apply 2 drops to eye daily as needed (dry eyes).    aspirin EC 81 MG tablet Take 81 mg by mouth daily.    BRILINTA  90 MG TABS tablet Take 1 tablet by mouth 2 (two) times daily.    clobetasol ointment (TEMOVATE) 9.67 % Apply 1 application topically daily as needed (rash).     furosemide (LASIX) 40 MG tablet Take 1 tablet by mouth daily.    gabapentin (NEURONTIN) 300 MG capsule Take 1 capsule by mouth every evening.     insulin aspart (NOVOLOG) 100 UNIT/ML injection Inject 5-7 Units into the skin 3 (three) times daily before meals. Per sliding scale    LEVEMIR FLEXTOUCH 100  UNIT/ML Pen Inject 22 Units as directed daily.    losartan (COZAAR) 50 MG tablet Take 1 tablet by mouth daily.    meclizine (ANTIVERT) 25 MG tablet Take 25 mg by mouth 3 (three) times daily as needed (veritgo).    metoprolol succinate (TOPROL-XL) 25 MG 24 hr tablet Take 1 tablet by mouth daily.    omeprazole (PRILOSEC) 20 MG capsule Take 1 capsule by mouth daily.    pioglitazone (ACTOS) 30 MG tablet Take 1 tablet by mouth daily.       Allergies  Allergen Reactions  . Statins Other (See Comments)    "Attacks the muscles"  . Tomato Hives  . Contrast Media [Iodinated Diagnostic Agents] Rash      The results of significant diagnostics from this hospitalization (including imaging, microbiology, ancillary and laboratory) are listed below for reference.     Labs: Basic Metabolic Panel:  Recent Labs Lab 09/07/14 0956  NA 137  K 3.9  CL 105  CO2 25  GLUCOSE 162*  BUN 28*  CREATININE 1.16*  CALCIUM 8.4*   CBC:  Recent Labs Lab 09/07/14 0956 09/08/14 0622  WBC 5.7 8.1  NEUTROABS 4.0 6.3  HGB 7.3* 9.9*  HCT 23.4* 30.9*  MCV 86.7 86.6  PLT 222 209    CBG:  Recent Labs Lab 09/07/14 1633 09/07/14 1719 09/07/14 2102 09/08/14 0748 09/08/14 1115  GLUCAP 49* 79 184* 120* 201*       Signed:  Kathie Dike, M.D.  [facility] 09/08/2014, 12:16 PM  I, Salvadore Oxford, acting a scribe, recorded this note contemporaneously in the presence of Dr. Kathie Dike, M.D. On 09/08/2014 at 12:26 PM    Attending:  I have reviewed the above documentation for accuracy and completeness, and I agree with the above.  Laren Orama

## 2014-09-08 NOTE — Progress Notes (Signed)
Inpatient Diabetes Program Recommendations  AACE/ADA: New Consensus Statement on Inpatient Glycemic Control (2013)  Target Ranges:  Prepandial:   less than 140 mg/dL      Peak postprandial:   less than 180 mg/dL (1-2 hours)      Critically ill patients:  140 - 180 mg/dL   Review of Glycemic Control:  Results for ANNETT, BOXWELL (MRN [id number]) as of 09/08/2014 11:02  Ref. Range 09/07/2014 16:33 09/07/2014 17:19 09/07/2014 21:02 09/08/2014 07:48  Glucose-Capillary Latest Ref Range: 65-99 mg/dL 49 (L) 79 184 (H) 120 (H)    Outpatient Diabetes medications: Levemir 22 units daily, Actos 30 mg daily, Novolog 5-7 units tid with meals Current orders for Inpatient glycemic control:  Novolog moderate tid with meals, Levemir 22 units daily  Note hypoglycemic event on 09/07/14, May consider reducing Novolog to sensitive tid with meals and HS.  Also consider reducing Levemir to 12 units daily.    Thanks, Adah Perl, RN, BC-ADM Inpatient Diabetes Coordinator Pager [phone] (8a-5p)

## 2014-09-08 NOTE — Consult Note (Addendum)
Reason for Consult:   GI bleed on antiplatelet therapy  Requesting Physician: [facility] Primary Cardiologist Dr Melany Guernsey- [facility]  HPI: This is a 81 y.o. female with a past medical history significant for HTN, DM, obesity, dyslipidemia with statin intolerance, and prior anemia- admitted to hospital in [place] in Dec 2015 with a NSTEMI. Cath there showed 3V CAD with an EF of 45-50% and she was transferred to [facility] for CABG. Ultimately she declined CABG and opted for PCI. She underwent OM3 PCI with DES 01/20/15 with plans for a staged PCI to the LAD and RCA. Post PCI she developed an allergic reaction, presumably secondary to the contrast. She was re admitted in Jan for PCI with contrast prophylaxis. She tolerated this well and has done well from a cardiac standpoint since.   She had labs drawn at her PCP Friday and was called Monday morning an instructed to go to the ED secondary to low blood count- Hgb 7.3. She had heme positive stool on exam. She denies any hematemesis, melena, or hematochezia. She denies chest pain.   PMHx:  Past Medical History  Diagnosis Date  . Hypertension   . MI (myocardial infarction) Dec 2015  . Diabetes mellitus without complication   . GERD (gastroesophageal reflux disease)   . Shingles     Past Surgical History  Procedure Laterality Date  . Cardiac catheterization  Dec 2015/Jan 2016    First procedure had allergic reaction to dye, had to do again in Jan 2016  . Abdominal hysterectomy    . Cholecystectomy      SOCHx:  reports that she has never smoked. She has never used smokeless tobacco. She reports that she does not drink alcohol or use illicit drugs.  FAMHx: Family History  Problem Relation Age of Onset  . Colon cancer Neg Hx     ALLERGIES: Allergies  Allergen Reactions  . Statins Other (See Comments)    "Attacks the muscles"  . Tomato Hives  . Contrast Media [Iodinated Diagnostic Agents] Rash    ROS: Pertinent items are  noted in HPI. See H&P for complete ROS No history of sleep apnea, "I sleep well" She has had prior colonoscopy for anemia- last one 6 yrs ago She is s/p surgery for diabetic retinopathy  HOME MEDICATIONS: Prior to Admission medications   Medication Sig Start Date End Date Taking? Authorizing Provider  amLODipine (NORVASC) 5 MG tablet Take 1 tablet by mouth daily. 06/22/14  Yes Historical Provider, MD  Artificial Tear Ointment (DRY EYES OP) Apply 2 drops to eye daily as needed (dry eyes).   Yes Historical Provider, MD  aspirin EC 81 MG tablet Take 81 mg by mouth daily.   Yes Historical Provider, MD  BRILINTA 90 MG TABS tablet Take 1 tablet by mouth 2 (two) times daily. 08/20/14  Yes Historical Provider, MD  clobetasol ointment (TEMOVATE) 4.81 % Apply 1 application topically daily as needed (rash).  07/14/14  Yes Historical Provider, MD  furosemide (LASIX) 40 MG tablet Take 1 tablet by mouth daily. 08/20/14  Yes Historical Provider, MD  gabapentin (NEURONTIN) 300 MG capsule Take 1 capsule by mouth every evening.  08/04/14  Yes Historical Provider, MD  insulin aspart (NOVOLOG) 100 UNIT/ML injection Inject 5-7 Units into the skin 3 (three) times daily before meals. Per sliding scale   Yes Historical Provider, MD  LEVEMIR FLEXTOUCH 100 UNIT/ML Pen Inject 22 Units as directed daily. 06/22/14  Yes Historical Provider, MD  losartan (COZAAR) 50 MG tablet Take 1 tablet by mouth daily. 06/22/14  Yes Historical Provider, MD  meclizine (ANTIVERT) 25 MG tablet Take 25 mg by mouth 3 (three) times daily as needed (veritgo).   Yes Historical Provider, MD  metoprolol succinate (TOPROL-XL) 25 MG 24 hr tablet Take 1 tablet by mouth daily. 07/13/14  Yes Historical Provider, MD  omeprazole (PRILOSEC) 20 MG capsule Take 1 capsule by mouth daily. 08/20/14  Yes Historical Provider, MD  pioglitazone (ACTOS) 30 MG tablet Take 1 tablet by mouth daily. 08/06/14  Yes Historical Provider, MD    HOSPITAL MEDICATIONS: I have reviewed  the patient's current medications.  VITALS: Blood pressure 151/46, pulse 66, temperature 98.1 F (36.7 C), temperature source Oral, resp. rate 18, height 5' (1.524 m), weight 188 lb 4.8 oz (85.412 kg), SpO2 97 %.  PHYSICAL EXAM: General appearance: alert, cooperative, no distress and moderately obese Neck: no carotid bruit and no JVD Lungs: clear to auscultation bilaterally Heart: regular rate and rhythm Abdomen: obese, non tender Extremities: trace edema Pulses: 2+ and symmetric Skin: Skin color, texture, turgor normal. No rashes or lesions Neurologic: Grossly normal  LABS: Results for orders placed or performed during the hospital encounter of 09/07/14 (from the past 24 hour(s))  Occult blood, poc device     Status: Abnormal   Collection Time: 09/07/14  9:55 AM  Result Value Ref Range   Fecal Occult Bld POSITIVE (A) NEGATIVE  CBC with Differential/Platelet     Status: Abnormal   Collection Time: 09/07/14  9:56 AM  Result Value Ref Range   WBC 5.7 4.0 - 10.5 K/uL   RBC 2.70 (L) 3.87 - 5.11 MIL/uL   Hemoglobin 7.3 (L) 12.0 - 15.0 g/dL   HCT 23.4 (L) 36.0 - 46.0 %   MCV 86.7 78.0 - 100.0 fL   MCH 27.0 26.0 - 34.0 pg   MCHC 31.2 30.0 - 36.0 g/dL   RDW 15.1 11.5 - 15.5 %   Platelets 222 150 - 400 K/uL   Neutrophils Relative % 70 43 - 77 %   Neutro Abs 4.0 1.7 - 7.7 K/uL   Lymphocytes Relative 17 12 - 46 %   Lymphs Abs 1.0 0.7 - 4.0 K/uL   Monocytes Relative 6 3 - 12 %   Monocytes Absolute 0.4 0.1 - 1.0 K/uL   Eosinophils Relative 6 (H) 0 - 5 %   Eosinophils Absolute 0.3 0.0 - 0.7 K/uL   Basophils Relative 1 0 - 1 %   Basophils Absolute 0.1 0.0 - 0.1 K/uL  Basic metabolic panel     Status: Abnormal   Collection Time: 09/07/14  9:56 AM  Result Value Ref Range   Sodium 137 135 - 145 mmol/L   Potassium 3.9 3.5 - 5.1 mmol/L   Chloride 105 101 - 111 mmol/L   CO2 25 22 - 32 mmol/L   Glucose, Bld 162 (H) 65 - 99 mg/dL   BUN 28 (H) 6 - 20 mg/dL   Creatinine, Ser 1.16 (H)  0.44 - 1.00 mg/dL   Calcium 8.4 (L) 8.9 - 10.3 mg/dL   GFR calc non Af Amer 43 (L) >60 mL/min   GFR calc Af Amer 50 (L) >60 mL/min   Anion gap 7 5 - 15  Protime-INR     Status: None   Collection Time: 09/07/14  9:56 AM  Result Value Ref Range   Prothrombin Time 14.1 11.6 - 15.2 seconds   INR 1.07 0.00 - 1.49  Type and  screen     Status: None (Preliminary result)   Collection Time: 09/07/14  9:56 AM  Result Value Ref Range   ABO/RH(D) A POS    Antibody Screen NEG    Sample Expiration 09/10/2014    Unit Number [id number]    Blood Component Type RED CELLS,LR    Unit division 00    Status of Unit ISSUED    Transfusion Status OK TO TRANSFUSE    Crossmatch Result Compatible    Unit Number [id number]    Blood Component Type RED CELLS,LR    Unit division 00    Status of Unit ISSUED,FINAL    Transfusion Status OK TO TRANSFUSE    Crossmatch Result Compatible   Vitamin B12     Status: None   Collection Time: 09/07/14  9:56 AM  Result Value Ref Range   Vitamin B-12 540 180 - 914 pg/mL  Iron and TIBC     Status: Abnormal   Collection Time: 09/07/14  9:56 AM  Result Value Ref Range   Iron 35 28 - 170 ug/dL   TIBC 472 (H) 250 - 450 ug/dL   Saturation Ratios 7 (L) 10.4 - 31.8 %   UIBC 437 ug/dL  Ferritin     Status: Abnormal   Collection Time: 09/07/14  9:56 AM  Result Value Ref Range   Ferritin 10 (L) 11 - 307 ng/mL  Reticulocytes     Status: Abnormal   Collection Time: 09/07/14  9:56 AM  Result Value Ref Range   Retic Ct Pct 2.1 0.4 - 3.1 %   RBC. 2.73 (L) 3.87 - 5.11 MIL/uL   Retic Count, Manual 57.3 19.0 - 186.0 K/uL  Prepare RBC     Status: None   Collection Time: 09/07/14  9:56 AM  Result Value Ref Range   Order Confirmation ORDER PROCESSED BY BLOOD BANK   ABO/Rh     Status: None   Collection Time: 09/07/14 10:02 AM  Result Value Ref Range   ABO/RH(D) A POS   TSH     Status: None   Collection Time: 09/07/14  2:43 PM  Result Value Ref Range   TSH 1.586 0.350 -  4.500 uIU/mL  Glucose, capillary     Status: Abnormal   Collection Time: 09/07/14  4:33 PM  Result Value Ref Range   Glucose-Capillary 49 (L) 65 - 99 mg/dL  Glucose, capillary     Status: None   Collection Time: 09/07/14  5:19 PM  Result Value Ref Range   Glucose-Capillary 79 65 - 99 mg/dL  Glucose, capillary     Status: Abnormal   Collection Time: 09/07/14  9:02 PM  Result Value Ref Range   Glucose-Capillary 184 (H) 65 - 99 mg/dL   Comment 1 Notify RN   CBC with Differential/Platelet     Status: Abnormal   Collection Time: 09/08/14  6:22 AM  Result Value Ref Range   WBC 8.1 4.0 - 10.5 K/uL   RBC 3.57 (L) 3.87 - 5.11 MIL/uL   Hemoglobin 9.9 (L) 12.0 - 15.0 g/dL   HCT 30.9 (L) 36.0 - 46.0 %   MCV 86.6 78.0 - 100.0 fL   MCH 27.7 26.0 - 34.0 pg   MCHC 32.0 30.0 - 36.0 g/dL   RDW 14.7 11.5 - 15.5 %   Platelets 209 150 - 400 K/uL   Neutrophils Relative % 77 43 - 77 %   Neutro Abs 6.3 1.7 - 7.7 K/uL   Lymphocytes Relative 11 (L) 12 -  46 %   Lymphs Abs 0.9 0.7 - 4.0 K/uL   Monocytes Relative 7 3 - 12 %   Monocytes Absolute 0.6 0.1 - 1.0 K/uL   Eosinophils Relative 4 0 - 5 %   Eosinophils Absolute 0.4 0.0 - 0.7 K/uL   Basophils Relative 0 0 - 1 %   Basophils Absolute 0.0 0.0 - 0.1 K/uL  Occult blood card to lab, stool     Status: Abnormal   Collection Time: 09/08/14  6:30 AM  Result Value Ref Range   Fecal Occult Bld POSITIVE (A) NEGATIVE  Glucose, capillary     Status: Abnormal   Collection Time: 09/08/14  7:48 AM  Result Value Ref Range   Glucose-Capillary 120 (H) 65 - 99 mg/dL   Comment 1 Notify RN    Comment 2 Document in Chart     EKG: pending  IMAGING: No results found.  IMPRESSION: Active Problems:   CAD S/P DES Dec 2015- Jan 2016   Heme positive stool   Anemia   Type 2 diabetes mellitus with renal manifestations   Essential hypertension   Chronic renal insufficiency, stage III (moderate)   Obesity BMI 37   Dyslipidemia-statin intol   Contrast media  allergy   RECOMMENDATION: She does not appear to be actively bleeding or unstable. She should continue Brilinta for at least 12 months if at all possible. MD to see. Will check EKG  Time Spent Directly with Patient: 45 minutes  Erlene Quan [phone] beeper 09/08/2014, 9:11 AM   The patient was seen and examined, and I agree with the assessment and plan as documented above, with modifications as noted below. Pt with aforementioned history of significant 3-vessel CAD who underwent PCI of OM3 in 01/2014 after refusing CABG, with subsequent drug-eluting stent placement to the proximal LAD in 02/2014.   Due to contrast given during the LAD intervention and due to concerns that the RCA intervention would be complex, the RCA was not treated and medical management was recommended, with consideration of PCI only if symptoms were to recur.  She is now admitted for a hemodynamically stable probable slow GI bleed. Hgb 7.3 pre-transfusion up to 9.9 now. Had been in 8 range in 02/2014.  Only complaints this morning are nausea after eating breakfast, attributed to "eating something sweet". Denies abdominal and chest pain as well as shortness of breath. Had been feeling fatigued last week and blamed it on heat and humidity.  Recommendations: Given her severe CAD in the RCA and with the locations of the drug-eluting stents (proximal LAD in particular), I do not recommend stopping dual antiplatelet therapy at this time so as not to precipitate late stent thrombosis. She appears to have a chronic anemia and will eventually need to investigate as to the source of GI bleed, but would not stop ASA or Brilinta to do so.

## 2014-09-08 NOTE — Telephone Encounter (Signed)
PATIENT HAS APPOINTMENT 12/08/14

## 2014-09-08 NOTE — Progress Notes (Signed)
    Subjective: BM today but without any evidence of overt bleeding. Wants to go home. Received 2 units PRBCs overnight. Feels less fatigued.   Objective: Vital signs in last 24 hours: Temp:  [98 F (36.7 C)-98.7 F (37.1 C)] 98.1 F (36.7 C) (08/02 0542) Pulse Rate:  [54-75] 66 (08/02 0542) Resp:  [18-20] 18 (08/02 0542) BP: (0-151)/(0-77) 151/46 mmHg (08/02 0542) SpO2:  [95 %-100 %] 97 % (08/02 0542) Weight:  [188 lb 4.8 oz (85.412 kg)] 188 lb 4.8 oz (85.412 kg) (08/01 1431) Last BM Date: 09/06/14 General:   Alert and oriented, pleasant Head:  Normocephalic and atraumatic. Eyes:  No icterus, sclera clear. Conjuctiva pink.  Mouth:  Without lesions, mucosa pink and moist.  Abdomen:  Bowel sounds present, soft, obese, non-tender, non-distended. Psych:  Alert and cooperative. Normal mood and affect.  Intake/Output from previous day: 08/01 0701 - 08/02 0700 In: 1210 [P.O.:240; I.V.:300; Blood:670] Out: 751 [Urine:750; Stool:1] Intake/Output this shift:    Lab Results:  Recent Labs  09/07/14 0956 09/08/14 0622  WBC 5.7 8.1  HGB 7.3* 9.9*  HCT 23.4* 30.9*  PLT 222 209   BMET  Recent Labs  09/07/14 0956  NA 137  K 3.9  CL 105  CO2 25  GLUCOSE 162*  BUN 28*  CREATININE 1.16*  CALCIUM 8.4*   PT/INR  Recent Labs  09/07/14 0956  LABPROT 14.1  INR 1.07     Assessment: 81 year old female admitted with acute on chronic anemia and heme positive stool, without evidence of overt GI bleeding. Improvement after 2 units PRBCs to 9.9. In presence of Brilinta after drug-eluting stents, dual antiplatelet therapy needs to be continued for a year. Would favor a conservative approach, monitoring H/H and transfuse as needed until Brilinta could be safely held.   Plan: Stable from GI standpoint for discharge with close outpatient follow-up No urgent colonoscopy/endoscopy indicated at this time Will arrange outpatient follow-up with our practice  Orvil Feil,  ANP-BC [facility]    LOS: 1 day    09/08/2014, 9:43 AM

## 2014-09-08 NOTE — Telephone Encounter (Signed)
Recently inpatient for heme positive stool, anemia. Please have patient complete a CBC in 1 month and have an office visit with me or Dr. Oneida Alar in 3 months.

## 2014-09-09 LAB — TYPE AND SCREEN
ABO/RH(D): A POS
Antibody Screen: NEGATIVE
Unit division: 0
Unit division: 0

## 2014-12-09 ENCOUNTER — Ambulatory Visit (INDEPENDENT_AMBULATORY_CARE_PROVIDER_SITE_OTHER): Payer: Medicare Other | Admitting: Gastroenterology

## 2014-12-09 ENCOUNTER — Encounter: Payer: Self-pay | Admitting: Gastroenterology

## 2014-12-09 VITALS — BP 129/54 | HR 73 | Temp 97.6°F | Ht 59.0 in | Wt 187.6 lb

## 2014-12-09 DIAGNOSIS — D62 Acute posthemorrhagic anemia: Secondary | ICD-10-CM | POA: Diagnosis not present

## 2014-12-09 NOTE — Progress Notes (Addendum)
REVIEWED-NO ADDITIONAL RECOMMENDATIONS. Primary Care Physician:  [name], NP  Primary GI: Dr. Oneida Alar   Chief Complaint  Patient presents with  . Follow-up    HPI:   Kelsey Palmer is a 81 y.o. female presenting today with a history of recent hospitalization in Aug 2016 for acute on chronic anemia, heme positive stool, without evidence of overt GI bleeding. Received 2 units during admission. Underwent stent placement 1/16 and on Brilinta with dual antiplatelet therapy required for one year. Cardiology consulted during hospitalization. Felt best to avoid stopping agents until 1 year had passed. In interim, supportive measures. Last Hgb at discharge was 9.9. Reportedly last colonoscopy in [place] about 6 years ago.   Without overt GI bleeding. Only complaint is of arthritis, pinched nerve in her neck. No issues with constipation or diarrhea. No reflux or dysphagia. Dr. Alroy Dust is cardiologist in [place]. Recent labs from outside provider dated Sept 2016 with Hgb 10.3.    Past Medical History  Diagnosis Date  . Hypertension   . Type 2 diabetes mellitus with renal manifestations ([place])   . GERD (gastroesophageal reflux disease)   . Shingles   . CAD S/P percutaneous coronary angioplasty 12/15- 1/16    OM3, staged LAD/RCA  . Ischemic cardiomyopathy Dec 2015    EF 45-50%  . NSTEMI (non-ST elevated myocardial infarction) ([place]) [place] 2015    cath done in [place]  . Chronic renal disease, stage III   . Obesity (BMI 30-39.9)   . Dyslipidemia     statin intol  . Anemia   . Contrast media allergy Dec 2015    Past Surgical History  Procedure Laterality Date  . Cardiac catheterization  Dec 2015/Jan 2016    First procedure had allergic reaction to dye, had to do again in Jan 2016  . Abdominal hysterectomy    . Cholecystectomy      Current Outpatient Prescriptions  Medication Sig Dispense Refill  . amLODipine (NORVASC) 5 MG tablet Take 1 tablet by mouth daily.    .  Artificial Tear Ointment (DRY EYES OP) Apply 2 drops to eye daily as needed (dry eyes).    Marland Kitchen aspirin EC 81 MG tablet Take 81 mg by mouth daily.    Marland Kitchen BRILINTA 90 MG TABS tablet Take 1 tablet by mouth 2 (two) times daily.    . clobetasol ointment (TEMOVATE) 0.94 % Apply 1 application topically daily as needed (rash).     . furosemide (LASIX) 40 MG tablet Take 1 tablet by mouth daily.    Marland Kitchen gabapentin (NEURONTIN) 300 MG capsule Take 1 capsule by mouth every evening.     . insulin aspart (NOVOLOG) 100 UNIT/ML injection Inject 5-7 Units into the skin 3 (three) times daily before meals. Per sliding scale    . LEVEMIR FLEXTOUCH 100 UNIT/ML Pen Inject 22 Units as directed daily.    Marland Kitchen losartan (COZAAR) 50 MG tablet Take 1 tablet by mouth daily.    . meclizine (ANTIVERT) 25 MG tablet Take 25 mg by mouth 3 (three) times daily as needed (veritgo).    . metoprolol succinate (TOPROL-XL) 25 MG 24 hr tablet Take 1 tablet by mouth daily.    Marland Kitchen omeprazole (PRILOSEC) 20 MG capsule Take 1 capsule by mouth daily.    . pioglitazone (ACTOS) 30 MG tablet Take 1 tablet by mouth daily.     No current facility-administered medications for this visit.    Allergies as of 12/09/2014 - Review Complete 12/09/2014  Allergen Reaction Noted  .  Statins Other (See Comments) 09/07/2014  . Tomato Hives 09/07/2014  . Contrast media [iodinated diagnostic agents] Rash 09/07/2014    Family History  Problem Relation Age of Onset  . Colon cancer Neg Hx     Social History   Social History  . Marital Status: Widowed    Spouse Name: N/A  . Number of Children: N/A  . Years of Education: N/A   Social History Main Topics  . Smoking status: Never Smoker   . Smokeless tobacco: Never Used  . Alcohol Use: No  . Drug Use: No  . Sexual Activity: Not Asked   Other Topics Concern  . None   Social History Narrative    Review of Systems: As mentioned in HPI   Physical Exam: BP 129/54 mmHg  Pulse 73  Temp(Src) 97.6 F (36.4  C) (Oral)  Ht 4\' 11"  (1.499 m)  Wt 187 lb 9.6 oz (85.095 kg)  BMI 37.87 kg/m2 General:   Alert and oriented. No distress noted. Pleasant and cooperative.  Head:  Normocephalic and atraumatic. Eyes:  Conjuctiva clear without scleral icterus. Abdomen:  +BS, soft, non-tender and non-distended. No rebound or guarding. No HSM or masses noted. Msk:  Symmetrical without gross deformities. Normal posture. Extremities:  1+ lower extremity edema  Neurologic:  Alert and  oriented x4;  grossly normal neurologically. Psych:  Alert and cooperative. Normal mood and affect.

## 2014-12-09 NOTE — Patient Instructions (Signed)
I will get the results from [facility].   We will see you back in January 2017!  Please call with any bright red blood per rectum or black, tarry stool.

## 2014-12-16 NOTE — Assessment & Plan Note (Signed)
Recent admission Aug 2016 with acute on chronic anemia and heme positive stool, no evidence of overt GI bleeding. Remains stable now, with improvement in recent check of Hgb to 10.3. As she is on Brilinta, unable to stop until around Jan 2017. Will need colonoscopy/EGD once able to safely come off Brilinta; however, in the meantime, will continue to monitor. Return in Jan 2017.

## 2014-12-16 NOTE — Progress Notes (Signed)
cc'ed to pcp °

## 2014-12-22 NOTE — Progress Notes (Signed)
Nov 2016 Hgb 8.2 from Hollice Gong, NP. Will need to monitor this closely.   Would be a good idea to go ahead and start iron 325 mg po BID. Any overt GI bleeding?

## 2014-12-25 NOTE — Progress Notes (Signed)
Pt is aware to start the iron bid and she said she is not having any bleeding.

## 2015-03-03 ENCOUNTER — Ambulatory Visit: Payer: Medicare Other | Admitting: Gastroenterology

## 2015-03-24 ENCOUNTER — Ambulatory Visit (INDEPENDENT_AMBULATORY_CARE_PROVIDER_SITE_OTHER): Payer: Medicare Other | Admitting: Gastroenterology

## 2015-03-24 ENCOUNTER — Encounter: Payer: Self-pay | Admitting: Gastroenterology

## 2015-03-24 ENCOUNTER — Ambulatory Visit: Payer: Medicare Other | Admitting: Gastroenterology

## 2015-03-24 VITALS — BP 122/49 | HR 75 | Temp 97.0°F | Ht 59.0 in | Wt 187.8 lb

## 2015-03-24 DIAGNOSIS — D62 Acute posthemorrhagic anemia: Secondary | ICD-10-CM

## 2015-03-24 NOTE — Assessment & Plan Note (Signed)
82 year old female admitted Aug 2016 with acute on chronic anemia, heme positive stool but no evidence of overt GI bleeding. On Brilinta, but this has been over a year since stent placement. We will check with Dr. Alroy Dust in [place], to see if this can be safely held for 48 hours prior to a procedure. She is on iron BID. Will also retrieve most recent labs by PCP, who has been following her anemia quite closely. Will ultimately need EGD/colonoscopy; however, patient desires to wait until May to arrange this, as she has difficulty with transportation and would rather have this done when it is lighter outside for longer. I discussed that she could have an occult process that needs evaluation, and postponing evaluation could affect outcome. She is fully aware of this. She has no concerning signs or symptoms currently. Return in May 2017 per patient request.

## 2015-03-24 NOTE — Patient Instructions (Signed)
We will see you back in May 2017 to schedule the colonoscopy and upper endoscopy.   Please let us know if you have any signs of obvious bleeding.

## 2015-03-24 NOTE — Progress Notes (Addendum)
REVIEWED-NO ADDITIONAL RECOMMENDATIONS.  Referring Provider: [name], NP Primary Care Physician:  [name], NP Primary GI: Dr. Oneida Alar   Chief Complaint  Patient presents with  . Anemia    HPI:   Kelsey Palmer is a 82 y.o. female presenting today with a history of hospitalization in Aug 2016 for acute on chronic anemia, heme positive stool, without evidence of overt GI bleeding. Received 2 units during admission. Underwent stent placement 02/2014 and on Brilinta with dual antiplatelet therapy required for one year. Cardiology consulted during hospitalization. Felt best to avoid stopping agents until 1 year had passed. In interim, supportive measures. Last Hgb at discharge was 9.9. Outside labs from Sept 2016 with Hgb 10.3. Nov 2016 Hgb 8.2. She was requested to start iron.   Would like to wait until it is warmer weather. States her ride would need longer days in order to get back home during daylight. She is fully aware that she could have an occult lesion that we would not be aware of, and postponing the colonoscopy/EGD would mean that any occult malignancy or other abnormality would not be addressed. Taking iron BID. Denies diarrhea. She does state that her morning BM is quick, and she has to be near a bathroom. No overt GI bleeding. Denies fatigue. States she feels good. States she goes out about every day.   Past Medical History  Diagnosis Date  . Hypertension   . Type 2 diabetes mellitus with renal manifestations ([place])   . GERD (gastroesophageal reflux disease)   . Shingles   . CAD S/P percutaneous coronary angioplasty 12/15- 1/16    OM3, staged LAD/RCA  . Ischemic cardiomyopathy Dec 2015    EF 45-50%  . NSTEMI (non-ST elevated myocardial infarction) ([place]) [place] 2015    cath done in [place]  . Chronic renal disease, stage III   . Obesity (BMI 30-39.9)   . Dyslipidemia     statin intol  . Anemia   . Contrast media allergy Dec 2015    Past Surgical History    Procedure Laterality Date  . Cardiac catheterization  Dec 2015/Jan 2016    First procedure had allergic reaction to dye, had to do again in Jan 2016  . Abdominal hysterectomy    . Cholecystectomy      Current Outpatient Prescriptions  Medication Sig Dispense Refill  . amLODipine (NORVASC) 5 MG tablet Take 1 tablet by mouth daily.    . Artificial Tear Ointment (DRY EYES OP) Apply 2 drops to eye daily as needed (dry eyes).    Marland Kitchen aspirin EC 81 MG tablet Take 81 mg by mouth daily.    Marland Kitchen BRILINTA 90 MG TABS tablet Take 1 tablet by mouth 2 (two) times daily.    . clobetasol ointment (TEMOVATE) [id number] % Apply 1 application topically daily as needed (rash).     . furosemide (LASIX) 40 MG tablet Take 1 tablet by mouth daily.    Marland Kitchen gabapentin (NEURONTIN) 300 MG capsule Take 1 capsule by mouth every evening.     . insulin regular (NOVOLIN R,HUMULIN R) 100 units/mL injection 5 Units 2 (two) times daily before a meal.     . LEVEMIR FLEXTOUCH 100 UNIT/ML Pen Inject 22 Units as directed daily.    Marland Kitchen losartan (COZAAR) 50 MG tablet Take 1 tablet by mouth daily.    . meclizine (ANTIVERT) 25 MG tablet Take 25 mg by mouth 3 (three) times daily as needed (veritgo).    . metoprolol succinate (TOPROL-XL) 25  MG 24 hr tablet Take 1 tablet by mouth daily.    Marland Kitchen omeprazole (PRILOSEC) 20 MG capsule Take 1 capsule by mouth daily.    . pioglitazone (ACTOS) 30 MG tablet Take 1 tablet by mouth daily.    . insulin aspart (NOVOLOG) 100 UNIT/ML injection Inject 5-7 Units into the skin 3 (three) times daily before meals. Reported on 03/24/2015     No current facility-administered medications for this visit.    Allergies as of 03/24/2015 - Review Complete 03/24/2015  Allergen Reaction Noted  . Statins Other (See Comments) 09/07/2014  . Tomato Hives 09/07/2014  . Contrast media [iodinated diagnostic agents] Rash 09/07/2014    Family History  Problem Relation Age of Onset  . Colon cancer Neg Hx     Social History    Social History  . Marital Status: Widowed    Spouse Name: N/A  . Number of Children: N/A  . Years of Education: N/A   Social History Main Topics  . Smoking status: Never Smoker   . Smokeless tobacco: Never Used  . Alcohol Use: No  . Drug Use: No  . Sexual Activity: Not Asked   Other Topics Concern  . None   Social History Narrative    Review of Systems: Gen: Denies fever, chills, anorexia. Denies fatigue, weakness, weight loss.  CV: Denies chest pain, palpitations, syncope, peripheral edema, and claudication. Resp: Denies dyspnea at rest, cough, wheezing, coughing up blood, and pleurisy. GI: see HPI  Derm: Denies rash, itching, dry skin Psych: Denies depression, anxiety, memory loss, confusion. No homicidal or suicidal ideation.  Heme: Denies bruising, bleeding, and enlarged lymph nodes.  Physical Exam: BP 122/49 mmHg  Pulse 75  Temp(Src) 97 F (36.1 C)  Ht 4\' 11"  (1.499 m)  Wt 187 lb 12.8 oz (85.186 kg)  BMI 37.91 kg/m2 General:   Alert and oriented. No distress noted. Pleasant and cooperative.  Head:  Normocephalic and atraumatic. Eyes:  Conjuctiva clear without scleral icterus. Mouth:  Oral mucosa pink and moist. Good dentition. No lesions. Abdomen:  +BS, soft, non-tender and non-distended. Limited exam with patient in chair, unable to get on exam table Msk:  Kyphosis  Extremities:  Possible chronic venous stasis lower extremities, 1+ trace edema lower extremities  Neurologic:  Alert and  oriented x4;  grossly normal neurologically. Psych:  Alert and cooperative. Normal mood and affect.

## 2015-03-25 NOTE — Progress Notes (Signed)
cc'ed to pcp °

## 2015-06-21 ENCOUNTER — Ambulatory Visit: Payer: Medicare Other | Admitting: Gastroenterology

## 2015-07-12 ENCOUNTER — Ambulatory Visit: Payer: Medicare Other | Admitting: Gastroenterology

## 2016-02-23 ENCOUNTER — Ambulatory Visit (HOSPITAL_COMMUNITY): Payer: Medicare Other | Admitting: Hematology & Oncology

## 2016-03-02 ENCOUNTER — Encounter (HOSPITAL_COMMUNITY): Payer: Medicare Other

## 2016-03-02 ENCOUNTER — Encounter (HOSPITAL_COMMUNITY): Payer: Self-pay | Admitting: Hematology & Oncology

## 2016-03-02 ENCOUNTER — Other Ambulatory Visit: Payer: Self-pay

## 2016-03-02 ENCOUNTER — Ambulatory Visit (INDEPENDENT_AMBULATORY_CARE_PROVIDER_SITE_OTHER): Payer: Medicare Other | Admitting: Gastroenterology

## 2016-03-02 ENCOUNTER — Encounter (HOSPITAL_COMMUNITY): Payer: Medicare Other | Attending: Hematology & Oncology | Admitting: Hematology & Oncology

## 2016-03-02 ENCOUNTER — Encounter: Payer: Self-pay | Admitting: Gastroenterology

## 2016-03-02 VITALS — BP 172/46 | HR 72 | Temp 98.1°F | Resp 20 | Ht 59.0 in | Wt 185.4 lb

## 2016-03-02 DIAGNOSIS — R1905 Periumbilic swelling, mass or lump: Secondary | ICD-10-CM

## 2016-03-02 DIAGNOSIS — C801 Malignant (primary) neoplasm, unspecified: Secondary | ICD-10-CM

## 2016-03-02 DIAGNOSIS — D649 Anemia, unspecified: Secondary | ICD-10-CM | POA: Diagnosis not present

## 2016-03-02 DIAGNOSIS — D500 Iron deficiency anemia secondary to blood loss (chronic): Secondary | ICD-10-CM | POA: Diagnosis not present

## 2016-03-02 DIAGNOSIS — K8689 Other specified diseases of pancreas: Secondary | ICD-10-CM

## 2016-03-02 DIAGNOSIS — K869 Disease of pancreas, unspecified: Secondary | ICD-10-CM

## 2016-03-02 DIAGNOSIS — D508 Other iron deficiency anemias: Secondary | ICD-10-CM | POA: Insufficient documentation

## 2016-03-02 LAB — CBC WITH DIFFERENTIAL/PLATELET
BASOS ABS: 0.1 10*3/uL (ref 0.0–0.1)
BASOS PCT: 1 %
Eosinophils Absolute: 0.2 10*3/uL (ref 0.0–0.7)
Eosinophils Relative: 4 %
HCT: 28.6 % — ABNORMAL LOW (ref 36.0–46.0)
Hemoglobin: 9.3 g/dL — ABNORMAL LOW (ref 12.0–15.0)
Lymphocytes Relative: 19 %
Lymphs Abs: 1.1 10*3/uL (ref 0.7–4.0)
MCH: 30.3 pg (ref 26.0–34.0)
MCHC: 32.5 g/dL (ref 30.0–36.0)
MCV: 93.2 fL (ref 78.0–100.0)
MONO ABS: 0.4 10*3/uL (ref 0.1–1.0)
MONOS PCT: 7 %
NEUTROS ABS: 3.9 10*3/uL (ref 1.7–7.7)
Neutrophils Relative %: 69 %
PLATELETS: 211 10*3/uL (ref 150–400)
RBC: 3.07 MIL/uL — ABNORMAL LOW (ref 3.87–5.11)
RDW: 14.3 % (ref 11.5–15.5)
WBC: 5.6 10*3/uL (ref 4.0–10.5)

## 2016-03-02 LAB — COMPREHENSIVE METABOLIC PANEL
ALBUMIN: 3 g/dL — AB (ref 3.5–5.0)
ALT: 15 U/L (ref 14–54)
ANION GAP: 7 (ref 5–15)
AST: 23 U/L (ref 15–41)
Alkaline Phosphatase: 64 U/L (ref 38–126)
BILIRUBIN TOTAL: 0.3 mg/dL (ref 0.3–1.2)
BUN: 20 mg/dL (ref 6–20)
CHLORIDE: 105 mmol/L (ref 101–111)
CO2: 26 mmol/L (ref 22–32)
Calcium: 8.4 mg/dL — ABNORMAL LOW (ref 8.9–10.3)
Creatinine, Ser: 1.25 mg/dL — ABNORMAL HIGH (ref 0.44–1.00)
GFR calc Af Amer: 45 mL/min — ABNORMAL LOW (ref >60)
GFR calc non Af Amer: 39 mL/min — ABNORMAL LOW (ref >60)
GLUCOSE: 205 mg/dL — AB (ref 65–99)
POTASSIUM: 3.7 mmol/L (ref 3.5–5.1)
Sodium: 138 mmol/L (ref 135–145)
TOTAL PROTEIN: 6.6 g/dL (ref 6.5–8.1)

## 2016-03-02 MED ORDER — HYDROCODONE-ACETAMINOPHEN 5-325 MG PO TABS: 1.0000 | ORAL_TABLET | ORAL | 0 refills | Status: AC | PRN

## 2016-03-02 NOTE — Progress Notes (Signed)
CC'ED TO PCP 

## 2016-03-02 NOTE — Patient Instructions (Signed)
I WILL HAVE IMAGES OFFICIAL REVIEWED FROM OUR RADIOLOGY DEPARTMENT.  I WILL SPEAK WITH DR. Whitney Muse AND IF ENDOSCOPY NEEDED WE WILL COMPLETE IT. YOU MAY NOT NEED TO BE TAKEN OFF BRILNTA IF ONLY BIOPSIES ARE REQUIRED.  PLEASE CALL WITH QUESTIONS OR CONCERNS.

## 2016-03-02 NOTE — Assessment & Plan Note (Addendum)
SINCE AUG 2016. CARE DELAYED BY [facility] AND PT BEING ON BRILINTA AS WELL AS TRANSPORTATION ISSUES. 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  APPT WITH DR. Whitney Muse TODAY. ATTEMPTED TO CONTACT RADIOLOGY ACCESS TO IMAGING FROM [place]. LOADED IMAGING-PROBABLE MASS IN TAIL OF  PANCREAS. WILL AWAIT REVIEW WITH DR. Thornton Papas. [facility] TBS SCHEDULED AFTER ONCOLOGY APPT. IF SOURCE OF ADENOCA IDENTIFIED THAN NO INDICATION FOR EGD/TCS.   GREATER THAN 50% WAS SPENT IN COUNSELING & COORDINATION OF CARE WITH THE PATIENT: DISCUSSED DIFFERENTIAL DIAGNOSIS, PROCEDURE, INDICATIONS AND MANAGEMENT OF ADENOCARCINOMA, PANCREAS. TOTAL ENCOUNTER TIME: 40 MINS.  1210-I PERSONALLY REVIEWED THE CT WITH DR. Thornton Papas. MASS IN TOP ABUTS POSTERIOR GASTRIC WALL AND SPLENIC ARTERY, NO INVOLVEMENT WITH SMA OR SMV. MOST LOW RISK WAY FOR BIOPSY WOULD BE EUS.

## 2016-03-02 NOTE — Progress Notes (Signed)
Dr. Erlinda Hong nurse Margreta Journey) is aware of referral. Referral info faxed to Dr. Erlinda Hong office.

## 2016-03-02 NOTE — Progress Notes (Signed)
Met with pt face to face.  Introduced myself and explain a little bit about my role as the patient navigator.  Pt given a card with all my information on it.  Told pt to call if they had any questions or concerns.  Pt verbalized understanding.   Pt will RTN after she has had a EUS completed.

## 2016-03-02 NOTE — Progress Notes (Signed)
[facility] NOTE  Patient Care Team: [name], NP as PCP - General (Nurse Practitioner) Danie Binder, MD as Consulting Physician (Gastroenterology)  CHIEF COMPLAINTS/PURPOSE OF CONSULTATION:  Biopsy of naval, c/w adenocarcinoma  HISTORY OF PRESENTING ILLNESS:  Kelsey Palmer 83 y.o. 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   As per Sudie Grumbling NP note on 02/15/16 " Patient here to discuss recent biopsy results done by Dermatology. Patient had biopsy of a nodule on Naval which came back positive for adenocarcinoma; the nodule was called a Sister [name] nodule which is associated with metastasis. Pathology report states that origin of cancer is most likely an upper GI source, however cannot rule out lower GI, colon, breast, pancreas, ovarian."  She was seen today by Dr. Barney Drain. CT imaging has been performed which notes a pancreatic mass in the tail of the pancreas.   Kelsey Palmer presents to the clinic today accompanied by a friend Kelsey Palmer and she is her POA.  She uses a cane to ambulate. I personally reviewed and went over labs and scans with the patient.   She likes to do puzzles but has trouble with her eyes. In 2017 she had a hemorrhage in her right eye. She got it fixed but 3 weeks ago it has gotten worse and has an appointment Monday for it.  She first found out about her cancer when ran her hand across her stomach while she was bathing and she felt a bump. It started getting bigger and bigger and when it finally burst the odour was "awful." She went to her PCP for it 2 months ago to get it cleaned and taken care of and it healed. It started happening again recently. So she thought something was not right, eventually it was biopsied and she was told it was cancer.   She notes that when they did the scans they put her all the way in and took a picture  and then they pulled her a bit out and took a picture. "They didn't tell me anything about it."  She states she is able to do everything at home. She states a pinched nerve in her lower back which causes pain. When she does work, her back hurts. So she will do some chores and rest, and then do the rest later.  She states that she doesn't have an appetite, but it doesn't bother her that she doesn't have one. She says she might get busy and then forget to eat lunch and when she does go to eat, she doesn't want to eat.  She states she lost weight. She was 236 and now is 185. She started trying to lose weight 2 years ago. Kelsey Palmer states she hasn't lost weight and  doesn't eat and it's about what she eats. Kelsey Palmer reports she eats a lot of [employer] and [employer] Kelsey Palmer denies her having weight loss. Mrs. Lenny Pastel denies eating a lot of fast food from those establishments and reports she only does it to give her friend from [place] when she takes her there.    She notes lower abdominal pain when walking.   She did not need any pain medicine prescriptions.   She asked if she should still be on Brilinta.   She presents today for further discussion of newly discussed adenocarcinoma, most likely of pancreatic origin.   MEDICAL HISTORY:  Past Medical  History:  Diagnosis Date  . Anemia   . CAD S/P percutaneous coronary angioplasty 12/15- 1/16   OM3, staged LAD/RCA  . Chronic renal disease, stage III   . Contrast media allergy Dec 2015  . Dyslipidemia    statin intol  . GERD (gastroesophageal reflux disease)   . Hypertension   . Ischemic cardiomyopathy Dec 2015   EF 45-50%  . NSTEMI (non-ST elevated myocardial infarction) ([place]) [place] 22-May-2013   cath done in [place]  . Obesity (BMI 30-39.9)   . Shingles   . Type 2 diabetes mellitus with renal manifestations ([place])     SURGICAL HISTORY: Past Surgical History:  Procedure Laterality Date  . ABDOMINAL HYSTERECTOMY    . CARDIAC CATHETERIZATION   Dec 2015/Jan 2016   First procedure had allergic reaction to dye, had to do again in Jan 2016  . CHOLECYSTECTOMY      SOCIAL HISTORY: Social History   Social History  . Marital status: Widowed    Spouse name: N/A  . Number of children: N/A  . Years of education: N/A   Occupational History  . Not on file.   Social History Main Topics  . Smoking status: Never Smoker  . Smokeless tobacco: Never Used  . Alcohol use No  . Drug use: No  . Sexual activity: Not on file   Other Topics Concern  . Not on file   Social History Narrative   RETIRED [employer]. LIVES WITH BROTHER. THEY TAKE CARE OF EACH OTHER. NO CHILDREN.   Social Hx: Lives with her brother No children- but raised her siblings She is the 6th of 48 siblings 77 are still living. Nonsmoker Hobbies: puzzles  FAMILY HISTORY: Family History  Problem Relation Age of Onset  . Colon cancer Neg Hx    Family Hx: Mom- 71 died- hx of diabetes, HTN Dad- 61 when he died- stroke 17 siblings in the family.  Brother in May 22, 2013 died of mouth cancer 1 Brother living- prostrate cancer 1 Sister alive, diagnosed with Dm, HTN Nephew -diagnosed with 2 types of cancer- doesn't know which kinds. Niece had a brain tumor    ALLERGIES:  is allergic to coconut oil; statins; tomato; clopidogrel; contrast media [iodinated diagnostic agents]; and orange oil.  MEDICATIONS:  Current Outpatient Prescriptions  Medication Sig Dispense Refill  . albuterol (PROVENTIL HFA;VENTOLIN HFA) 108 (90 Base) MCG/ACT inhaler     . HYDROcodone-acetaminophen (LORTAB) 7.5-500 MG tablet     . prednisoLONE acetate (PRED FORTE) 1 % ophthalmic suspension     . amLODipine (NORVASC) 5 MG tablet Take 1 tablet by mouth daily.    . Artificial Tear Ointment (DRY EYES OP) Apply 2 drops to eye daily as needed (dry eyes).    Marland Kitchen aspirin EC 81 MG tablet Take 81 mg by mouth daily.    Marland Kitchen BRILINTA 90 MG TABS tablet Take 1 tablet by mouth 2 (two) times daily.    .  clobetasol ointment (TEMOVATE) [id number] % Apply 1 application topically daily as needed (rash).     . furosemide (LASIX) 40 MG tablet Take 1 tablet by mouth daily.    Marland Kitchen gabapentin (NEURONTIN) 300 MG capsule Take 1 capsule by mouth every evening.     Marland Kitchen HYDROcodone-acetaminophen (NORCO/VICODIN) 5-325 MG tablet Take 1 tablet by mouth every 4 (four) hours as needed for moderate pain. 60 tablet 0  . insulin regular (NOVOLIN R,HUMULIN R) 100 units/mL injection 5 Units 2 (two) times daily before a meal.     .  LEVEMIR FLEXTOUCH 100 UNIT/ML Pen Inject 22 Units as directed daily.    Marland Kitchen losartan (COZAAR) 50 MG tablet Take 1 tablet by mouth daily.    . meclizine (ANTIVERT) 25 MG tablet Take 25 mg by mouth 3 (three) times daily as needed (veritgo).    . metoprolol succinate (TOPROL-XL) 25 MG 24 hr tablet Take 1 tablet by mouth daily.    Marland Kitchen omeprazole (PRILOSEC) 20 MG capsule Take 1 capsule by mouth daily.    . pioglitazone (ACTOS) 30 MG tablet Take 1 tablet by mouth daily.    . Vitamin D, Ergocalciferol, (DRISDOL) 50000 units CAPS capsule Take 50,000 Units by mouth every 7 (seven) days.     No current facility-administered medications for this visit.     Review of Systems  Constitutional: Positive for malaise/fatigue and weight loss (She notes going from 236 to 185 pounds in the last 2 years due to trying to lose weight. However her friend denies her losing any weight).       Doesn't have an appetite  HENT: Negative.   Eyes: Negative.   Respiratory: Negative.   Cardiovascular: Negative.   Gastrointestinal: Positive for abdominal pain (lower abdominal pain when walking).  Genitourinary: Negative.   Musculoskeletal: Negative.   Skin: Negative.   Neurological: Negative.        Pinched nerve in her lower back that causes pain.  Endo/Heme/Allergies: Negative.   Psychiatric/Behavioral: Negative.   All other systems reviewed and are negative.  14 point ROS was done and is otherwise as detailed above or in  HPI   PHYSICAL EXAMINATION: ECOG PERFORMANCE STATUS: 2 - Symptomatic, <50% confined to bed  Vitals:   03/02/16 1331  BP: (!) 172/46  Pulse: 72  Resp: 20  Temp: 98.1 F (36.7 C)   Filed Weights   03/02/16 1331  Weight: 185 lb 6.4 oz (84.1 kg)    Physical Exam  Constitutional: She is oriented to person, place, and time and well-developed, well-nourished, and in no distress.  She uses a cane to ambulate and needed assistance getting on the examination table. She has dentures on the top teeth and partials on the bottom.    HENT:  Head: Normocephalic and atraumatic.  Mouth/Throat: No oropharyngeal exudate.  Eyes: Conjunctivae and EOM are normal. Pupils are equal, round, and reactive to light. No scleral icterus.  Neck: Normal range of motion. Neck supple.  Cardiovascular: Normal rate, regular rhythm and normal heart sounds.   Pulmonary/Chest: Effort normal and breath sounds normal. No respiratory distress.  Abdominal: Soft. Bowel sounds are normal. She exhibits no distension. There is no rebound and no guarding.  Large mass at umbilicus, small amount of purulent drainage. Moderate tenderness on palpation.   Musculoskeletal: Normal range of motion.  Lymphadenopathy:    She has no cervical adenopathy.  Neurological: She is alert and oriented to person, place, and time. No cranial nerve deficit.  Needs assistance onto exam table.   Skin: Skin is warm and dry.  Psychiatric: Mood, memory, affect and judgment normal.  Nursing note and vitals reviewed.   LABORATORY DATA:  I have reviewed the data as listed Lab Results  Component Value Date   WBC 8.1 09/08/2014   HGB 9.9 (L) 09/08/2014   HCT 30.9 (L) 09/08/2014   MCV 86.6 09/08/2014   PLT 209 09/08/2014   CMP     Component Value Date/Time   NA 137 09/07/2014 0956   K 3.9 09/07/2014 0956   CL 105 09/07/2014 0956  CO2 25 09/07/2014 0956   GLUCOSE 162 (H) 09/07/2014 0956   BUN 28 (H) 09/07/2014 0956   CREATININE 1.16 (H)  09/07/2014 0956   CALCIUM 8.4 (L) 09/07/2014 0956   GFRNONAA 43 (L) 09/07/2014 0956   GFRAA 50 (L) 09/07/2014 [id number]     RADIOGRAPHIC STUDIES: I have personally reviewed the radiological images as listed and agreed with the findings in the report. No results found.   Outside Report reviewed  PATHOLOGY: Outside results noted below:   ASSESSMENT & PLAN: Adenocarcinoma Sister [name] Nodule Mass in tail of pancreas Anemia  Labs and scans reviewed. Results noted above. CT imaging sent for scanning into EPIC. Disc sent to radiology.   I talked to her about the possible origins of her cancer. We discussed that this is most likely pancreatic cancer. We discussed that based upon her presentation, it is not curable. I talked to her about treatment in the future. I gave them educational material on pancreatic cancer.  She will get blood drawn at the end of her visit today, we will check a CA 19-9.   I told her that Dr. Oneida Alar is getting her scheduled to see a special GI doctor for a endoscopic ultrasound and biopsy. At this point she may want to consider treatment. She may be able to tolerate Gemzar/abraxane. We briefly addressed therapy today. WE discussed that it is not curative but palliative, it can improve pain, prolong life.   She will meet Anderson Malta, the nurse navigator today.   I did write her for a low dose of hydrocodone today.    I gave her some samples today of boost/ensure. I scheduled her to see the nutritionist.   She asked if she should still be on Brilinta. I advised her that she may need to hold it prior to biopsy, Dr. Paulita Fujita will advise.   She will RTC post-biopsy for additional recommendations and to review final pathology.   ORDERS PLACED FOR THIS ENCOUNTER: Orders Placed This Encounter  Procedures  . Cancer antigen 19-9  . CEA  . CBC with Differential  . Comprehensive metabolic panel    MEDICATIONS PRESCRIBED THIS ENCOUNTER: Meds ordered this encounter    Medications  . albuterol (PROVENTIL HFA;VENTOLIN HFA) 108 (90 Base) MCG/ACT inhaler  . HYDROcodone-acetaminophen (LORTAB) 7.5-500 MG tablet  . prednisoLONE acetate (PRED FORTE) 1 % ophthalmic suspension  . HYDROcodone-acetaminophen (NORCO/VICODIN) 5-325 MG tablet    Sig: Take 1 tablet by mouth every 4 (four) hours as needed for moderate pain.    Dispense:  60 tablet    Refill:  0    This document serves as a record of services personally performed by Ancil Linsey, MD. It was created on her behalf by Shirlean Mylar, a trained medical scribe. The creation of this record is based on the scribe's personal observations and the provider's statements to them. This document has been checked and approved by the attending provider.  I have reviewed the above documentation for accuracy and completeness and I agree with the above.  This note was electronically signed.   Kelby Fam. Christell Steinmiller, M.D.  03/02/2016 2:17 PM

## 2016-03-02 NOTE — Patient Instructions (Addendum)
[place] at [facility] Discharge Instructions  RECOMMENDATIONS MADE BY THE CONSULTANT AND ANY TEST RESULTS WILL BE SENT TO YOUR REFERRING PHYSICIAN.  You were seen today by Dr. Whitney Muse  Follow up in clinic after biopsy  We have scheduled you for a nutrition consult, the dietician will be in contact with you.  We refilled your hydrocodone, prescription provided.  You have met Shellia Carwin, RN, she is our patient nurse navigator.  If you need anything you can call her [phone]  You will have lab work today and we will call you with the results   Thank you for choosing [place] at [facility] to provide your oncology and hematology care.  To afford each patient quality time with our provider, please arrive at least 15 minutes before your scheduled appointment time.    If you have a lab appointment with the [facility] please come in thru the  Main Entrance and check in at the main information desk  You need to re-schedule your appointment should you arrive 10 or more minutes late.  We strive to give you quality time with our providers, and arriving late affects you and other patients whose appointments are after yours.  Also, if you no show three or more times for appointments you may be dismissed from the clinic at the providers discretion.     Again, thank you for choosing [facility].  Our hope is that these requests will decrease the amount of time that you wait before being seen by our physicians.       _____________________________________________________________  Should you have questions after your visit to [facility], please contact our office at [phone] between the hours of 8:30 a.m. and 4:30 p.m.  Voicemails left after 4:30 p.m. will not be returned until the following business day.  For prescription refill requests, have your pharmacy contact our office.       Resources For Cancer  Patients and their Caregivers ? American Cancer Society: Can assist with transportation, wigs, general needs, runs Look Good Feel Better.        445-502-8947 ? Cancer Care: Provides financial assistance, online support groups, medication/co-pay assistance.  1-800-813-HOPE 424-836-9751) ? [facility] Assists [place] cancer patients and their families through emotional , educational and financial support.  601-173-7464 ? [place] DSS Where to apply for food stamps, Medicaid and utility assistance. (305)046-7160 ? [facility]: Transportation to medical appointments. (314)268-5564 ? Social Security Administration: May apply for disability if have a Stage IV cancer. (517) 114-2372 (705)880-9165 ? [employer], Disability and Transit Services: Assists with nutrition, care and transit needs. [place] Support Programs: _0 @ > Cancer Support Group  2nd Tuesday of the month 1pm-2pm, Journey Room  > Creative Journey  3rd Tuesday of the month 1130am-1pm, Journey Room  > Look Good Feel Better  1st Wednesday of the month 10am-12 noon, Journey Room (Call [place] to register [phone])

## 2016-03-02 NOTE — Progress Notes (Signed)
Subjective:    Patient ID: Kelsey Palmer, female    DOB: [birth year], 83 y.o.   MRN: [id number] Kelsey Rival, NP  HPI SAW DR. Ahmed Palmer FOR WOUND. BIOPSY OF NAVEL AND SHE HAD ADENOCA POSSIBLE UGI SOURCE. HAD CT SCAN([facility]) AND MAMMOGRAM AND SEEING DR. Whitney Palmer THIS EVENING.  MAY HAVE PANCREAS LESION. STAYS COLD. BELLY BUTTON STILL HURTS BUT IT'S JUST A HOLE. PAIN WHEN SHE BENDS OVER. BMs: GOOD. IF EATS CEREAL SHE HAS A BM WITHIN 1-2 HRS. IF DOESN'T EAT CEREAL SHE MAY HAVE CONSTIPATION. WAS ON IRON AND TURNED STOOLS BLACK BUT THEY ARE FORMED. MAY GETTING MEDS TO MAKE HER SLEEPY.  PT DENIES FEVER, CHILLS, HEMATOCHEZIA, HEMATEMESIS, nausea, vomiting, melena, diarrhea, CHEST PAIN, SHORTNESS OF BREATH, CHANGE IN BOWEL IN HABITS,  problems swallowing, problems with sedation, OR heartburn or indigestion.  Past Medical History:  Diagnosis Date  . Anemia   . CAD S/P percutaneous coronary angioplasty 12/15- 1/16   OM3, staged LAD/RCA  . Chronic renal disease, stage III   . Contrast media allergy Dec 2015  . Dyslipidemia    statin intol  . GERD (gastroesophageal reflux disease)   . Hypertension   . Ischemic cardiomyopathy Dec 2015   EF 45-50%  . NSTEMI (non-ST elevated myocardial infarction) ([place]) [place] 2015   cath done in [place]  . Obesity (BMI 30-39.9)   . Shingles   . Type 2 diabetes mellitus with renal manifestations [facility])     Past Surgical History:  Procedure Laterality Date  . ABDOMINAL HYSTERECTOMY    . CARDIAC CATHETERIZATION  Dec 2015/Jan 2016   First procedure had allergic reaction to dye, had to do again in Jan 2016  . CHOLECYSTECTOMY      Allergies  Allergen Reactions  . Statins Other (See Comments)    "Attacks the muscles"  . Tomato Hives  . Contrast Media [Iodinated Diagnostic Agents] Rash    Current Outpatient Prescriptions  Medication Sig Dispense Refill  . aspirin EC 81 MG tablet Take 81 mg by mouth daily.    Marland Kitchen BRILINTA 90 MG TABS tablet  Take 1 tablet by mouth 2 (two) times daily.    . furosemide (LASIX) 40 MG tablet Take 1 tablet by mouth daily.    Marland Kitchen gabapentin (NEURONTIN) 300 MG capsule Take 1 capsule by mouth every evening.     . insulin regular (NOVOLIN R,HUMULIN R) 100 units/mL injection 5 Units 2 (two) times daily before a meal.     . LEVEMIR FLEXTOUCH 100 UNIT/ML Pen Inject 22 Units as directed daily.    Marland Kitchen losartan (COZAAR) 50 MG tablet Take 1 tablet by mouth daily.    . metoprolol succinate (TOPROL-XL) 25 MG 24 hr tablet Take 1 tablet by mouth daily.    Marland Kitchen omeprazole (PRILOSEC) 20 MG capsule Take 1 capsule by mouth daily.    . pioglitazone (ACTOS) 30 MG tablet Take 1 tablet by mouth daily.    . Vitamin D, Ergocalciferol, (DRISDOL) 50000 units CAPS capsule Take 50,000 Units by mouth every 7 (seven) days.    Marland Kitchen amLODipine (NORVASC) 5 MG tablet Take 1 tablet by mouth daily.    . Artificial Tear Ointment (DRY EYES OP) Apply 2 drops to eye daily as needed (dry eyes).    . clobetasol ointment (TEMOVATE) AB-123456789 % Apply 1 application topically daily as needed (rash).     . meclizine (ANTIVERT) 25 MG tablet Take 25 mg by mouth 3 (three) times daily as needed (veritgo).  Social History   Social History  . Marital status: Widowed    Spouse name: N/A  . Number of children: N/A  . Years of education: N/A   Social History Main Topics  . Smoking status: Never Smoker  . Smokeless tobacco: Never Used  . Alcohol use No  . Drug use: No  . 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 NO CHILDREN.    Review of Systems PER HPI OTHERWISE ALL SYSTEMS ARE NEGATIVE.    Objective:   Physical Exam  Constitutional: She is oriented to person, place, and time. She appears well-developed and well-nourished. No distress.  HENT:  Head: Normocephalic and atraumatic.  Mouth/Throat: Oropharynx is clear and moist. No oropharyngeal exudate.  Eyes: Pupils are equal, round, and  reactive to light. No scleral icterus.  Neck: Normal range of motion. Neck supple.  Cardiovascular: Normal rate, regular rhythm and normal heart sounds.   Pulmonary/Chest: Effort normal and breath sounds normal. No respiratory distress.  Abdominal: Soft. Bowel sounds are normal. She exhibits no distension. There is tenderness. There is no rebound and no guarding.  SISTER Kelsey Palmer NODULE, SMALL AMOUNT OF PURULENT DRAINAGE AT UMBILICUS.  MODERATE TENDERNESS IN LEFT PERIUMBILICAL REGION AND EPIGASTRIUM.  Musculoskeletal: She exhibits no edema.  Lymphadenopathy:    She has no cervical adenopathy.  Neurological: She is alert and oriented to person, place, and time.  NO  NEW FOCAL DEFICITS  Psychiatric:  FLAT AFFECT, NL MOOD  Vitals reviewed.     Assessment & Plan:

## 2016-03-02 NOTE — Progress Notes (Addendum)
Disussed with Dr. Whitney Muse. Requests endoscopy for tissue diagnosis. EUS WITH DR. WILL OUTLAW ASAP, Dx: MASS IN TAIL OF PANCREAS, ADENOCARCINOMA AT UMBILICUS(SISTER MARY JOSEPH NODULE).

## 2016-03-03 ENCOUNTER — Telehealth: Payer: Self-pay | Admitting: Gastroenterology

## 2016-03-03 ENCOUNTER — Ambulatory Visit (HOSPITAL_COMMUNITY): Payer: Medicare Other

## 2016-03-03 LAB — CEA: CEA: 5.2 ng/mL — AB (ref 0.0–4.7)

## 2016-03-03 LAB — CANCER ANTIGEN 19-9: CA 19 9: 5522 U/mL — AB (ref 0–35)

## 2016-03-03 NOTE — Telephone Encounter (Signed)
Patient wanted to let slf know that she has gotten her appointment with dr. Paulita Fujita scheduled

## 2016-03-03 NOTE — Progress Notes (Signed)
Nutrition Assessment   Reason for Assessment:   Referral for weight loss. Patient preferred phone visit  ASSESSMENT:  Spoke with patient via phone this pm. 83 year female with adenocarcinoma nodule on navel (Sister [name] nodule) question if primary is upper GI origin.  Work up is in progress.  Planning EUS.    Patient reports that she has a poor appetite. "I just don't eat as much as I use too." Reports typical day is cereal with milk or egg and sausage or bacon or cheese toast with coffee, then for lunch has boost and fruit, then for dinner dried beans, juice or salad with spinach, carrots, egg.  Also reports eats chicken, fish, [place] and likes peanut butter.  Add in mixed fruit and applesauce at times.    Nutrition Focused Physical Exam: phone visit  Medications: reviewed  Labs: reviewed  Anthropometrics:   Height: 4 feet 11 inches Weight: 185 pounds UBW: 175-180-181 lb since middle of 2017 per pt.  Was at about 236 pounds about 2 years ago.  BMI: 37  Reviewed chart and noted stable weight of 185-188 lb since 09/07/2014.    NUTRITION DIAGNOSIS: none at this time   MALNUTRITION DIAGNOSIS: none at this time   INTERVENTION:   Discussed strategies to help with poor appetite.  Patient also interested in blood glucose control and foods to eat.  Will mail "Poor Appetite" and Blood Glucose Control" handout Discussed importance of good sources of lean protein (plant based and animal based) as well as good well balanced diet to maintain good nutrition.  Patient verbalized understanding.    MONITORING, EVALUATION, GOAL: Patient will consume adequate nutrition to improve blood glucose and maintain lean muscle mass.   NEXT VISIT: as needed  Yurianna Tusing B. Zenia Resides, Whitney, [place] (pager)

## 2016-03-03 NOTE — Telephone Encounter (Signed)
REVIEWED-NO ADDITIONAL RECOMMENDATIONS. 

## 2016-03-07 ENCOUNTER — Telehealth: Payer: Self-pay

## 2016-03-07 NOTE — Telephone Encounter (Signed)
Called [facility] (Dr. Erlinda Hong office) to f/u on referral. Pt has appt 03/09/16 at 1:15pm.

## 2016-03-09 ENCOUNTER — Telehealth (HOSPITAL_COMMUNITY): Payer: Self-pay | Admitting: Oncology

## 2016-03-09 NOTE — Telephone Encounter (Signed)
Dr. Paulita Fujita of GI called me today to discuss patient's case, since she is in the office to see him for EUS with biopsy. She has a pancreatic mass with a recent biopsy of a sister mary joseph node on the skin which came back as adenocarcinoma of UGI origin. Ca 19-9 also markedly elevated. Dr. Paulita Fujita wanted to discuss whether a pancreatic mass bx by EUS is still necessary given the above information which points towards adenoCA of the pancreas. He is concerned about patient's frail state given her age and co-morbidities.  After discussion with Dr. Paulita Fujita, the plan of care is outlined as below: If patient tells Dr. Paulita Fujita that she does not want any treatment of suspected pancreatic carcinoma, then no biopsy of the pancreatic mass is necessary.  If patient does want treatment then she is to proceed with EUS with bx and staging workup.

## 2016-03-21 ENCOUNTER — Telehealth (HOSPITAL_COMMUNITY): Payer: Self-pay | Admitting: Emergency Medicine

## 2016-03-21 NOTE — Telephone Encounter (Signed)
Pt called to let me know that Dr Paulita Fujita had to reschedule her EUS for 03/30/2016.  So I rescheduled her follow up appt to 04/05/2016 at 9:30 am.  I told her that if she needed to change this then she could just call me back.

## 2016-03-28 ENCOUNTER — Ambulatory Visit (HOSPITAL_COMMUNITY): Payer: Medicare Other | Admitting: Hematology & Oncology

## 2016-03-30 ENCOUNTER — Ambulatory Visit (HOSPITAL_COMMUNITY): Payer: Medicare Other

## 2016-04-05 ENCOUNTER — Encounter (HOSPITAL_COMMUNITY): Payer: Medicare Other | Attending: Oncology | Admitting: Oncology

## 2016-04-05 ENCOUNTER — Encounter (HOSPITAL_COMMUNITY): Payer: Medicare Other

## 2016-04-05 ENCOUNTER — Encounter (HOSPITAL_COMMUNITY): Payer: Self-pay

## 2016-04-05 VITALS — BP 172/56 | HR 80 | Temp 98.2°F | Resp 20 | Wt 184.8 lb

## 2016-04-05 DIAGNOSIS — D136 Benign neoplasm of pancreas: Secondary | ICD-10-CM | POA: Insufficient documentation

## 2016-04-05 DIAGNOSIS — K869 Disease of pancreas, unspecified: Secondary | ICD-10-CM | POA: Diagnosis not present

## 2016-04-05 DIAGNOSIS — R1905 Periumbilic swelling, mass or lump: Secondary | ICD-10-CM | POA: Diagnosis not present

## 2016-04-05 DIAGNOSIS — D649 Anemia, unspecified: Secondary | ICD-10-CM | POA: Diagnosis not present

## 2016-04-05 DIAGNOSIS — C259 Malignant neoplasm of pancreas, unspecified: Secondary | ICD-10-CM | POA: Insufficient documentation

## 2016-04-05 DIAGNOSIS — C801 Malignant (primary) neoplasm, unspecified: Secondary | ICD-10-CM | POA: Diagnosis not present

## 2016-04-05 DIAGNOSIS — K8689 Other specified diseases of pancreas: Secondary | ICD-10-CM

## 2016-04-05 DIAGNOSIS — D508 Other iron deficiency anemias: Secondary | ICD-10-CM | POA: Diagnosis not present

## 2016-04-05 HISTORY — DX: Malignant neoplasm of pancreas, unspecified: C25.9

## 2016-04-05 LAB — CBC WITH DIFFERENTIAL/PLATELET
Basophils Absolute: 0.1 10*3/uL (ref 0.0–0.1)
Basophils Relative: 1 %
EOS PCT: 5 %
Eosinophils Absolute: 0.3 10*3/uL (ref 0.0–0.7)
HCT: 30.9 % — ABNORMAL LOW (ref 36.0–46.0)
Hemoglobin: 9.8 g/dL — ABNORMAL LOW (ref 12.0–15.0)
LYMPHS ABS: 0.7 10*3/uL (ref 0.7–4.0)
LYMPHS PCT: 12 %
MCH: 29.2 pg (ref 26.0–34.0)
MCHC: 31.7 g/dL (ref 30.0–36.0)
MCV: 92 fL (ref 78.0–100.0)
MONO ABS: 0.4 10*3/uL (ref 0.1–1.0)
Monocytes Relative: 7 %
Neutro Abs: 4.5 10*3/uL (ref 1.7–7.7)
Neutrophils Relative %: 75 %
PLATELETS: 284 10*3/uL (ref 150–400)
RBC: 3.36 MIL/uL — ABNORMAL LOW (ref 3.87–5.11)
RDW: 13.2 % (ref 11.5–15.5)
WBC: 5.9 10*3/uL (ref 4.0–10.5)

## 2016-04-05 LAB — COMPREHENSIVE METABOLIC PANEL
ALT: 16 U/L (ref 14–54)
AST: 21 U/L (ref 15–41)
Albumin: 3 g/dL — ABNORMAL LOW (ref 3.5–5.0)
Alkaline Phosphatase: 74 U/L (ref 38–126)
Anion gap: 5 (ref 5–15)
BUN: 18 mg/dL (ref 6–20)
CHLORIDE: 101 mmol/L (ref 101–111)
CO2: 28 mmol/L (ref 22–32)
CREATININE: 1.05 mg/dL — AB (ref 0.44–1.00)
Calcium: 8.6 mg/dL — ABNORMAL LOW (ref 8.9–10.3)
GFR, EST AFRICAN AMERICAN: 56 mL/min — AB (ref >60)
GFR, EST NON AFRICAN AMERICAN: 48 mL/min — AB (ref >60)
Glucose, Bld: 246 mg/dL — ABNORMAL HIGH (ref 65–99)
POTASSIUM: 4.2 mmol/L (ref 3.5–5.1)
Sodium: 134 mmol/L — ABNORMAL LOW (ref 135–145)
Total Bilirubin: 0.3 mg/dL (ref 0.3–1.2)
Total Protein: 7.4 g/dL (ref 6.5–8.1)

## 2016-04-05 NOTE — Progress Notes (Signed)
[place]  PROGRESS NOTE  Patient Care Team: [name], NP as PCP - General (Nurse Practitioner) Danie Binder, MD as Consulting Physician (Gastroenterology)  CHIEF COMPLAINTS/PURPOSE OF CONSULTATION:  Biopsy of naval, c/w adenocarcinoma  HISTORY OF PRESENTING ILLNESS:  Kelsey Palmer 83 y.o. female is here for follow up of adenocarcinoma revealed on biopsy of the umbilicus. She was seen by Dr. Barney Drain. CT imaging has been performed which notes a pancreatic mass in the tail of the pancreas.   As per Sudie Grumbling NP note on 02/15/16 " Patient here to discuss recent biopsy results done by Dermatology. Patient had biopsy of a nodule on Naval which came back positive for adenocarcinoma; the nodule was called a Sister [name] nodule which is associated with metastasis. Pathology report states that origin of cancer is most likely an upper GI source, however cannot rule out lower GI, colon, breast, pancreas, ovarian."  She is here for follow up with her friend. Patient is a very poor historian. She has been doing well. She forgot her hearing aids today. She states she saw Dr. Paulita Fujita on 03/09/16 and had her EUS on 03/30/2016, however I do not have the path report from biopsy of the pancreatic mass. She has some abdominal pain, near her naval. Denies abdominal pain after eating, fatigue, or any other concerns. She is very active and does all of her own basic house chores. She doesn't nap throughout the day.   MEDICAL HISTORY:  Past Medical History:  Diagnosis Date  . Anemia   . CAD S/P percutaneous coronary angioplasty 12/15- 1/16   OM3, staged LAD/RCA  . Chronic renal disease, stage III   . Contrast media allergy Dec 2015  . Dyslipidemia    statin intol  . GERD (gastroesophageal reflux disease)   . Hypertension   . Ischemic cardiomyopathy Dec 2015   EF 45-50%  . NSTEMI (non-ST elevated myocardial infarction) ([place]) [place] 05-21-2013   cath done in [place]  .  Obesity (BMI 30-39.9)   . Shingles   . Type 2 diabetes mellitus with renal manifestations ([place])     SURGICAL HISTORY: Past Surgical History:  Procedure Laterality Date  . ABDOMINAL HYSTERECTOMY    . CARDIAC CATHETERIZATION  Dec 2015/Jan 2016   First procedure had allergic reaction to dye, had to do again in Jan 2016  . CHOLECYSTECTOMY      SOCIAL HISTORY: Social History   Social History  . Marital status: Widowed    Spouse name: N/A  . Number of children: N/A  . Years of education: N/A   Occupational History  . Not on file.   Social History Main Topics  . Smoking status: Never Smoker  . Smokeless tobacco: Never Used  . Alcohol use No  . Drug use: No  . Sexual activity: Not on file   Other Topics Concern  . Not on file   Social History Narrative   RETIRED [employer]. LIVES WITH BROTHER. THEY TAKE CARE OF EACH OTHER. NO CHILDREN.   Social Hx: Lives with her brother No children- but raised her siblings She is the 6th of 60 siblings 43 are still living. Nonsmoker Hobbies: puzzles  FAMILY HISTORY: Family History  Problem Relation Age of Onset  . Colon cancer Neg Hx    Family Hx: Mom- 64 died- hx of diabetes, HTN Dad- 26 when he died- stroke 17 siblings in the family.  Brother in May 21, 2013 died of mouth cancer 1 Brother living- prostrate  cancer 1 Sister alive, diagnosed with Dm, HTN Nephew -diagnosed with 2 types of cancer- doesn't know which kinds. Niece had a brain tumor  ALLERGIES:  is allergic to coconut oil; statins; tomato; clopidogrel; contrast media [iodinated diagnostic agents]; and orange oil.  MEDICATIONS:  Current Outpatient Prescriptions  Medication Sig Dispense Refill  . albuterol (PROVENTIL HFA;VENTOLIN HFA) 108 (90 Base) MCG/ACT inhaler     . amLODipine (NORVASC) 5 MG tablet Take 1 tablet by mouth daily.    . Artificial Tear Ointment (DRY EYES OP) Apply 2 drops to eye daily as needed (dry eyes).    Marland Kitchen aspirin EC 81 MG tablet Take 81 mg by  mouth daily.    Marland Kitchen BRILINTA 90 MG TABS tablet Take 1 tablet by mouth 2 (two) times daily.    . clobetasol ointment (TEMOVATE) [id number] % Apply 1 application topically daily as needed (rash).     . furosemide (LASIX) 40 MG tablet Take 1 tablet by mouth daily.    Marland Kitchen gabapentin (NEURONTIN) 300 MG capsule Take 1 capsule by mouth every evening.     Marland Kitchen HYDROcodone-acetaminophen (LORTAB) 7.5-500 MG tablet     . HYDROcodone-acetaminophen (NORCO/VICODIN) 5-325 MG tablet Take 1 tablet by mouth every 4 (four) hours as needed for moderate pain. 60 tablet 0  . insulin regular (NOVOLIN R,HUMULIN R) 100 units/mL injection 5 Units 2 (two) times daily before a meal.     . LEVEMIR FLEXTOUCH 100 UNIT/ML Pen Inject 22 Units as directed daily.    Marland Kitchen losartan (COZAAR) 50 MG tablet Take 1 tablet by mouth daily.    . meclizine (ANTIVERT) 25 MG tablet Take 25 mg by mouth 3 (three) times daily as needed (veritgo).    . metoprolol succinate (TOPROL-XL) 25 MG 24 hr tablet Take 1 tablet by mouth daily.    Marland Kitchen omeprazole (PRILOSEC) 20 MG capsule Take 1 capsule by mouth daily.    . pioglitazone (ACTOS) 30 MG tablet Take 1 tablet by mouth daily.    . prednisoLONE acetate (PRED FORTE) 1 % ophthalmic suspension     . Vitamin D, Ergocalciferol, (DRISDOL) 50000 units CAPS capsule Take 50,000 Units by mouth every 7 (seven) days.     No current facility-administered medications for this visit.     Review of Systems  Constitutional: Negative.  Negative for malaise/fatigue.  HENT: Positive for hearing loss.   Eyes: Negative.   Respiratory: Negative.   Cardiovascular: Negative.   Gastrointestinal: Positive for abdominal pain (at [employer]).       No pain after eating  Genitourinary: Negative.   Musculoskeletal: Negative.   Skin: Negative.   Neurological: Negative.   Endo/Heme/Allergies: Negative.   Psychiatric/Behavioral: Negative.   All other systems reviewed and are negative. 14 point ROS was done and is otherwise as detailed above  or in HPI  PHYSICAL EXAMINATION: ECOG PERFORMANCE STATUS: 2 - Symptomatic, <50% confined to bed  Vitals:   04/05/16 0930  BP: (!) 172/56  Pulse: 80  Resp: 20  Temp: 98.2 F (36.8 C)   Filed Weights   04/05/16 0930  Weight: 184 lb 12.8 oz (83.8 kg)   Physical Exam  Constitutional: She is oriented to person, place, and time and well-developed, well-nourished, and in no distress.  She uses a cane to ambulate and needed assistance getting on the examination table.  HENT:  Head: Normocephalic and atraumatic.  Mouth/Throat: No oropharyngeal exudate.  She has dentures on the top teeth and partials on the bottom.    Eyes: Conjunctivae  and EOM are normal. Pupils are equal, round, and reactive to light. No scleral icterus.  Neck: Normal range of motion. Neck supple.  Cardiovascular: Normal rate, regular rhythm and normal heart sounds.   Pulmonary/Chest: Effort normal and breath sounds normal. No respiratory distress.  Abdominal: Soft. Bowel sounds are normal. She exhibits no distension. There is tenderness (epigastric region). There is no rebound and no guarding.  Large mass at umbilicus, small amount of purulent drainage. Moderate tenderness on palpation.   Musculoskeletal: Normal range of motion.  Lymphadenopathy:    She has no cervical adenopathy.  Neurological: She is alert and oriented to person, place, and time. No cranial nerve deficit.  Skin: Skin is warm and dry.  Psychiatric: Mood, memory, affect and judgment normal.  Nursing note and vitals reviewed.  LABORATORY DATA:  I have reviewed the data as listed Lab Results  Component Value Date   WBC 5.6 03/02/2016   HGB 9.3 (L) 03/02/2016   HCT 28.6 (L) 03/02/2016   MCV 93.2 03/02/2016   PLT 211 03/02/2016   CMP     Component Value Date/Time   NA 138 03/02/2016 1440   K 3.7 03/02/2016 1440   CL 105 03/02/2016 1440   CO2 26 03/02/2016 1440   GLUCOSE 205 (H) 03/02/2016 1440   BUN 20 03/02/2016 1440   CREATININE 1.25  (H) 03/02/2016 1440   CALCIUM 8.4 (L) 03/02/2016 1440   PROT 6.6 03/02/2016 1440   ALBUMIN 3.0 (L) 03/02/2016 1440   AST 23 03/02/2016 1440   ALT 15 03/02/2016 1440   ALKPHOS 64 03/02/2016 1440   BILITOT 0.3 03/02/2016 1440   GFRNONAA 39 (L) 03/02/2016 1440   GFRAA 45 (L) 03/02/2016 1440   RADIOGRAPHIC STUDIES: I have personally reviewed the radiological images as listed and agreed with the findings in the report.  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 We will request it from Dr. Erlinda Hong office.  Staging PET-CT today. Labs; CBC, CMP, CA 19-9.  She will return for follow up in 2 weeks, after her scans to determine treatment plan. She may be able to tolerate gemzar/abraxane or gemzar alone.   This document serves as a record of services personally performed by Twana First, MD. It was created on her behalf by Martinique Casey, a trained medical scribe. The creation of this record is based on the scribe's personal observations and the provider's statements to them. This document has been checked and approved by the attending provider.  I have reviewed the above documentation for accuracy and completeness and I agree with the above.  This note was electronically signed.  04/05/2016 9:32 AM

## 2016-04-05 NOTE — Patient Instructions (Addendum)
[place] at [facility] Discharge Instructions  RECOMMENDATIONS MADE BY THE CONSULTANT AND ANY TEST RESULTS WILL BE SENT TO YOUR REFERRING PHYSICIAN.  You were seen today by Dr. Twana First Lab work today we will call you with the results We will schedule you for PET scan Follow up in 2 weeks See Amy up front for appointments   Thank you for choosing [place] at [facility] to provide your oncology and hematology care.  To afford each patient quality time with our provider, please arrive at least 15 minutes before your scheduled appointment time.    If you have a lab appointment with the [facility] please come in thru the  Main Entrance and check in at the main information desk  You need to re-schedule your appointment should you arrive 10 or more minutes late.  We strive to give you quality time with our providers, and arriving late affects you and other patients whose appointments are after yours.  Also, if you no show three or more times for appointments you may be dismissed from the clinic at the providers discretion.     Again, thank you for choosing [facility].  Our hope is that these requests will decrease the amount of time that you wait before being seen by our physicians.       _____________________________________________________________  Should you have questions after your visit to [facility], please contact our office at [phone] between the hours of 8:30 a.m. and 4:30 p.m.  Voicemails left after 4:30 p.m. will not be returned until the following business day.  For prescription refill requests, have your pharmacy contact our office.       Resources For Cancer Patients and their Caregivers ? American Cancer Society: Can assist with transportation, wigs, general needs, runs Look Good Feel Better.        (903)477-5601 ? Cancer Care: Provides financial assistance, online support groups,  medication/co-pay assistance.  1-800-813-HOPE 725-804-1815) ? [place] Assists [place] cancer patients and their families through emotional , educational and financial support.  (269) 519-4319 ? [place] DSS Where to apply for food stamps, Medicaid and utility assistance. 415-338-0709 ? [employer]: Transportation to medical appointments. 678-344-8895 ? Social Security Administration: May apply for disability if have a Stage IV cancer. 629-286-0947 (918)314-0138 ? [employer], Disability and Transit Services: Assists with nutrition, care and transit needs. [place] Support Programs: @10RELATIVEDAYS @ > Cancer Support Group  2nd Tuesday of the month 1pm-2pm, Journey Room  > Creative Journey  3rd Tuesday of the month 1130am-1pm, Journey Room  > Look Good Feel Better  1st Wednesday of the month 10am-12 noon, Journey Room (Call [place] to register [phone])

## 2016-04-06 LAB — CANCER ANTIGEN 19-9: CA 19 9: 7836 U/mL — AB (ref 0–35)

## 2016-04-18 ENCOUNTER — Encounter (HOSPITAL_COMMUNITY): Payer: Medicare Other

## 2016-04-21 ENCOUNTER — Ambulatory Visit (HOSPITAL_COMMUNITY): Payer: Medicare Other | Admitting: Oncology

## 2016-04-21 ENCOUNTER — Encounter (HOSPITAL_COMMUNITY)
Admission: RE | Admit: 2016-04-21 | Discharge: 2016-04-21 | Disposition: A | Discharge status: Home / Self Care | Payer: Medicare Other | Source: Ambulatory Visit | Attending: Oncology | Admitting: Oncology

## 2016-04-21 DIAGNOSIS — K869 Disease of pancreas, unspecified: Secondary | ICD-10-CM | POA: Insufficient documentation

## 2016-04-21 DIAGNOSIS — K8689 Other specified diseases of pancreas: Secondary | ICD-10-CM

## 2016-04-21 LAB — GLUCOSE, CAPILLARY: GLUCOSE-CAPILLARY: 152 mg/dL — AB (ref 65–99)

## 2016-04-21 MED ORDER — FLUDEOXYGLUCOSE F - 18 (FDG) INJECTION: 9.2000 | Freq: Once | INTRAVENOUS | Status: DC | PRN

## 2016-04-25 ENCOUNTER — Encounter (HOSPITAL_COMMUNITY): Payer: Self-pay | Admitting: Oncology

## 2016-04-28 ENCOUNTER — Encounter (HOSPITAL_COMMUNITY): Payer: Medicare Other | Attending: Oncology | Admitting: Oncology

## 2016-04-28 ENCOUNTER — Telehealth (HOSPITAL_COMMUNITY): Payer: Self-pay | Admitting: *Deleted

## 2016-04-28 ENCOUNTER — Encounter (HOSPITAL_COMMUNITY): Payer: Medicare Other

## 2016-04-28 ENCOUNTER — Encounter (HOSPITAL_COMMUNITY): Payer: Self-pay | Admitting: Oncology

## 2016-04-28 DIAGNOSIS — R1905 Periumbilic swelling, mass or lump: Secondary | ICD-10-CM

## 2016-04-28 DIAGNOSIS — C259 Malignant neoplasm of pancreas, unspecified: Secondary | ICD-10-CM

## 2016-04-28 DIAGNOSIS — C252 Malignant neoplasm of tail of pancreas: Secondary | ICD-10-CM | POA: Diagnosis not present

## 2016-04-28 DIAGNOSIS — D508 Other iron deficiency anemias: Secondary | ICD-10-CM | POA: Insufficient documentation

## 2016-04-28 MED ORDER — BACITRACIN 500 UNIT/GM EX OINT: 1.0000 "application " | TOPICAL_OINTMENT | Freq: Three times a day (TID) | CUTANEOUS | 0 refills | Status: DC

## 2016-04-28 NOTE — Progress Notes (Signed)
Nutrition Follow-up:  Nutrition follow-up completed in the clinic following MD visit. Patient with stage IV pancreatic cancer and planning palliative chemo treatment.   Patient reports appetite is fair.  Has not been really eating much meat due to loose partials and unable to chew very good.  Reports that she typically has hot cereal or cold cereal for breakfast or boiled egg and toast, sometimes forgets to eat lunch.  If eats lunch will have peanut butter sandwich or hamburger. Reports for dinner last night ate [place] wings, grits and cream of celery soup and drank diet soda.  Reports drinking 1 boost original per day.    Reports that she has hiatal hernia and can't eat anything too heavy in the evening.     Medications: reviewed  Labs: reviewed  Anthropometrics:   Weight today in clinic was 177 pounds 6.4 oz decreased from 184 pounds 12.8 oz on 2/28  4% weight loss in the last month.     NUTRITION DIAGNOSIS: Inadequate oral intake related to cancer as evidenced by decreased intake and weight loss of    MALNUTRITION DIAGNOSIS: none at this time   INTERVENTION:   Discussed differences between oral nutrition supplements in calorie level.  Provided ensure plus samples and coupons.  Patient likes boost products. Encouraged good sources of protein at every meal especially soft moist proteins as difficulty with chewing due to loose fitting dentures.  Patient verbalized understanding.    MONITORING, EVALUATION, GOAL: Patient will consume adequate nutrition to improve blood glucose and maintain lean muscle mass.    NEXT VISIT: as needed  Kelsey Palmer, [place], [place] Registered Dietitian [phone] (pager)

## 2016-04-28 NOTE — Assessment & Plan Note (Addendum)
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  Oncology history developed.  Staging in [facility] problem list completed as well.  Labs from 04/05/2016 reviewed with the patient: CBC diff, CMET, CA 19-9.  I personally reviewed and went over laboratory results with the patient.  The results are noted within this dictation.  CA 19-9 is elevated at 7836 which is indicative of Stage IV Pancreatic Cancer (given clinical scenario and recent biopsies).  I personally reviewed and went over radiographic studies with the patient.  The results are noted within this dictation.  PET scan on 04/21/2016 demonstrates a tail of pancreas mass that is hypermetabolic and a markedly hypermetabolic irregular soft tissue mass at the umbilicus.  She underwent EUS by Dr. Paulita Fujita on 03/30/2016.  I personally reviewed and went over pathology results with the patient.  Pathology from EUS demonstrates/confirms pancreatic adenocarcinoma.  Pathology report from Vickii Chafe lymph node biopsy confirms metastatic disease (adenocarcinoma), presumed to be pancreatic primary.  With this information, the patient is advised of her Stage IV disease and the incurability of her pancreatic cancer.  With this information, the patient is advised that her cancer is treatable, but NOT curable.  We discussed treatment options including systemic chemotherapy in a palliative fashion in an attempt to improve progression free survival, symptom control, and hopefully overall survival.  Goals of care discussion broached.  CODE STATUS broached as well.  She will need a port-a-cath placed.  I reviewed the role of this device in her cancer care.  Will refer patient to Dr. Arnoldo Morale for port placement.  I reviewed palliative systemic chemotherapy options:  1. Single-agent gemcitabine  2. Gemcitabine/Abraxane She has opted for the latter option.  I reviewed the risks, benefits, alternatives, and side effects of  chemotherapy including, but not limited to, nausea, vomiting, diarrhea, constipation, bone pain, peripheral neuropathy, peripheral edema, nail changes, decrease in blood counts, increase risk for infection, anaphylaxis, death.  She will be referred for chemotherapy teaching.  Gemcitabine/Abraxane chemotherapy regimen is built using VIA pathway.  Will plan on starting following port-a-cath placement.  Pre-treatment labs ordered: Day 1- CBC diff, CMET, CA 19-9. Day 8 and 15- CBC diff, CMET.  I have escribed bacitracin for her umbilical lesion.  Return on day 8 of cycle 1 for follow-up appointment and tolerability check.

## 2016-04-28 NOTE — Telephone Encounter (Signed)
Pt did not want to pay copay today  

## 2016-04-28 NOTE — Progress Notes (Signed)
Kelsey Rival, NP [address] [place] [address]  Adenocarcinoma of pancreas ([place]) - Plan: bacitracin 500 UNIT/GM ointment  CURRENT THERAPY: Treatment recommendations.  INTERVAL HISTORY: Kelsey Palmer 83 y.o. 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    Adenocarcinoma of pancreas ([place])   03/31/2016 Procedure    EUS by Dr. Paulita Fujita.  Round mass is identified in the pancreatic tail.  Mass is hypoechoic and heterogeneous.  Mass measures 35 mm x 30 mm in maximal cross-sectional diameter.  The endosonographic borders were well-defined.  There was sonographic evidence suggesting invasion into the peripancreatic fat.   remainder of the pancreas was examined.  The endosonographic appearance of the parenchyma in the upstream pancreatic duct indicated ductal dilatation.  Fine-needle aspiration for cytology was performed.  A few lymph nodes are visualized and measured in the peripancreatic region.      04/05/2016 Pathology Results    Malignant cells consistent with adenocarcinoma.      04/21/2016 PET scan    1. Mass in the tail of pancreas near the body tail junction is markedly hypermetabolic as is the irregular soft tissue mass at the umbilicus. No other hypermetabolic disease is identified in the chest, abdomen, or pelvis to suggest other site of primary malignancy for other metastatic involvement. 2. Irregular bladder wall thickening with gas noted in the bladder lumen. Features may be related to recent instrumentation, but bladder infection could have this imaging appearance. 3.  Abdominal Aortic Atherosclerois (ICD10-170.0)      She is here today to learn of most recent work-up results.  She notes that her umbilical lesion continues to drain.  She reports that it is malodorous.  She has it nicely covered.  She denies any fevers or chills.  She denies any pain.  Review of Systems    Constitutional: Positive for weight loss. Negative for chills and fever.  HENT: Negative.   Eyes: Negative.   Respiratory: Negative.  Negative for cough.   Cardiovascular: Negative.  Negative for chest pain.  Gastrointestinal: Negative.  Negative for blood in stool, constipation, diarrhea, melena, nausea and vomiting.  Genitourinary: Negative.   Musculoskeletal: Negative.   Skin:       Umbilical lesion with discharge, malodorous  Neurological: Negative.  Negative for weakness.  Endo/Heme/Allergies: Negative.   Psychiatric/Behavioral: Negative.     Past Medical History:  Diagnosis Date  . Adenocarcinoma of pancreas ([place]) 04/05/2016  . Anemia   . CAD S/P percutaneous coronary angioplasty 12/15- 1/16   OM3, staged LAD/RCA  . Chronic renal disease, stage III   . Contrast media allergy Dec 2015  . Dyslipidemia    statin intol  . GERD (gastroesophageal reflux disease)   . Hypertension   . Ischemic cardiomyopathy Dec 2015   EF 45-50%  . NSTEMI (non-ST elevated myocardial infarction) ([place]) [place] 2015   cath done in [place]  . Obesity (BMI 30-39.9)   . Shingles   . Type 2 diabetes mellitus with renal manifestations [facility])     Past Surgical History:  Procedure Laterality Date  . ABDOMINAL HYSTERECTOMY    . CARDIAC CATHETERIZATION  Dec 2015/Jan 2016   First procedure had allergic reaction to dye, had to do again in Jan 2016  . CHOLECYSTECTOMY      Family History  Problem Relation Age of Onset  . Colon cancer Neg Hx     Social History   Social  History  . Marital status: Widowed    Spouse name: N/A  . Number of children: N/A  . Years of education: N/A   Social History Main Topics  . Smoking status: Never Smoker  . Smokeless tobacco: Never Used  . Alcohol use No  . Drug use: No  . Sexual activity: Not Asked   Other Topics Concern  . 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 THEY TAKE CARE OF EACH OTHER. NO CHILDREN.      PHYSICAL EXAMINATION  ECOG PERFORMANCE STATUS: 2 - Symptomatic, <50% confined to bed  Vitals:   04/28/16 1134  BP: (!) 152/41  Pulse: 72  Resp: 16  Temp: 98.1 F (36.7 C)    GENERAL:alert, well nourished, well developed, comfortable, cooperative, obese and poorly educated on current situation, accompanied by niece who works in [employer]. SKIN: skin color, texture, turgor are normal, positive for: umbilical fungating lesion with purulent discharge. HEAD: Normocephalic, No masses, lesions, tenderness or abnormalities EYES: normal EARS: External ears normal OROPHARYNX:lips, buccal mucosa, and tongue normal  NECK: supple LYMPH:  not examined BREAST:not examined LUNGS: clear to auscultation  HEART: regular rate & rhythm ABDOMEN:abdomen soft, obese, normal bowel sounds and see skin exam above BACK: Back symmetric, no curvature. EXTREMITIES:less then 2 second capillary refill, no joint deformities, effusion, or inflammation, no skin discoloration, no cyanosis  NEURO: alert & oriented x 3 with fluent speech, no focal motor/sensory deficits   LABORATORY DATA: CBC    Component Value Date/Time   WBC 5.9 04/05/2016 1018   RBC 3.36 (L) 04/05/2016 1018   HGB 9.8 (L) 04/05/2016 1018   HCT 30.9 (L) 04/05/2016 1018   PLT 284 04/05/2016 1018   MCV 92.0 04/05/2016 1018   MCH 29.2 04/05/2016 1018   MCHC 31.7 04/05/2016 1018   RDW 13.2 04/05/2016 1018   LYMPHSABS 0.7 04/05/2016 1018   MONOABS 0.4 04/05/2016 1018   EOSABS 0.3 04/05/2016 1018   BASOSABS 0.1 04/05/2016 1018      Chemistry      Component Value Date/Time   NA 134 (L) 04/05/2016 1018   K 4.2 04/05/2016 1018   CL 101 04/05/2016 1018   CO2 28 04/05/2016 1018   BUN 18 04/05/2016 1018   CREATININE 1.05 (H) 04/05/2016 1018      Component Value Date/Time   CALCIUM 8.6 (L) 04/05/2016 1018   ALKPHOS 74 04/05/2016 1018   AST 21 04/05/2016 1018   ALT 16 04/05/2016 1018   BILITOT 0.3 04/05/2016 1018         PENDING LABS:   RADIOGRAPHIC STUDIES:  Nm Pet Image Initial (pi) Skull Base To Thigh  Result Date: 04/21/2016 CLINICAL DATA:  Initial treatment strategy for pancreatic mass and biopsy-proven adenocarcinoma at the umbilicus. EXAM: NUCLEAR MEDICINE PET SKULL BASE TO THIGH TECHNIQUE: 9.2 mCi F-18 FDG was injected intravenously. Full-ring PET imaging was performed from the skull base to thigh after the radiotracer. CT data was obtained and used for attenuation correction and anatomic localization. FASTING BLOOD GLUCOSE:  Value: 152 mg/dl COMPARISON:  Outside CT scan from "[facility]" dated the 02/29/2016 FINDINGS: NECK No hypermetabolic lymph nodes in the neck. CHEST No hypermetabolic mediastinal or hilar nodes. No suspicious pulmonary nodules on the CT scan. Heart size upper normal. Coronary artery atherosclerosis is evident. Assessment of lung parenchyma limited by breathing motion on this non breath hold study, but no suspicious pulmonary nodule or mass is evident. ABDOMEN/PELVIS 3.2 x 4.4 cm soft  tissue mass in the tail the pancreas near the junction with the body is markedly hypermetabolic with SUV max = 9. The irregular 5.3 x 6.3 cm lesion at the umbilicus is markedly hypermetabolic with SUV max = 10. No hypermetabolic disease identified in the liver. No hypermetabolic lymphadenopathy in the abdomen and there is no abdominal lymphadenopathy by CT imaging. There is abdominal aortic atherosclerosis without aneurysm. Gallbladder surgically absent. Small hiatal hernia noted The bladder is nondistended and has anterior irregular wall thickening. Gas is visible in the bladder lumen. SKELETON No focal hypermetabolic activity to suggest skeletal metastasis. Bone windows reveal no worrisome lytic or sclerotic osseous lesions. IMPRESSION: 1. Mass in the tail of pancreas near the body tail junction is markedly hypermetabolic as is the irregular soft tissue mass at the umbilicus. No other hypermetabolic disease is  identified in the chest, abdomen, or pelvis to suggest other site of primary malignancy for other metastatic involvement. 2. Irregular bladder wall thickening with gas noted in the bladder lumen. Features may be related to recent instrumentation, but bladder infection could have this imaging appearance. 3.  Abdominal Aortic Atherosclerois (ICD10-170.0) Electronically Signed   By: Misty Stanley M.D.   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  Oncology history developed.  Staging in [facility] problem list completed as well.  Labs from 04/05/2016 reviewed with the patient: CBC diff, CMET, CA 19-9.  I personally reviewed and went over laboratory results with the patient.  The results are noted within this dictation.  CA 19-9 is elevated at 7836 which is indicative of Stage IV Pancreatic Cancer (given clinical scenario and recent biopsies).  I personally reviewed and went over radiographic studies with the patient.  The results are noted within this dictation.  PET scan on 04/21/2016 demonstrates a tail of pancreas mass that is hypermetabolic and a markedly hypermetabolic irregular soft tissue mass at the umbilicus.  She underwent EUS by Dr. Paulita Fujita on 03/30/2016.  I personally reviewed and went over pathology results with the patient.  Pathology from EUS demonstrates/confirms pancreatic adenocarcinoma.  Pathology report from [facility] lymph node biopsy confirms metastatic disease (adenocarcinoma), presumed to be pancreatic primary.  With this information, the patient is advised of her Stage IV disease and the incurability of her pancreatic cancer.  With this information, the patient is advised that her cancer is treatable, but NOT curable.  We discussed treatment options including systemic chemotherapy in a  palliative fashion in an attempt to improve progression free survival, symptom control, and hopefully overall survival.  Goals of care discussion broached.  CODE STATUS broached as well.  She will need a port-a-cath placed.  I reviewed the role of this device in her cancer care.  Will refer patient to Dr. Arnoldo Morale for port placement.  I reviewed palliative systemic chemotherapy options:  1. Single-agent gemcitabine  2. Gemcitabine/Abraxane She has opted for the latter option.  I reviewed the risks, benefits, alternatives, and side effects of chemotherapy including, but not limited to, nausea, vomiting, diarrhea, constipation, bone pain, peripheral neuropathy, peripheral edema, nail changes, decrease in blood counts, increase risk for infection, anaphylaxis, death.  She will be referred for chemotherapy teaching.  Gemcitabine/Abraxane chemotherapy regimen is built using VIA pathway.  Will plan on starting  following port-a-cath placement.  Pre-treatment labs ordered: Day 1- CBC diff, CMET, CA 19-9. Day 8 and 15- CBC diff, CMET.  I have escribed bacitracin for her umbilical lesion.  Return on day 8 of cycle 1 for follow-up appointment and tolerability check.   ORDERS PLACED FOR THIS ENCOUNTER: No orders of the defined types were placed in this encounter.   MEDICATIONS PRESCRIBED THIS ENCOUNTER: Meds ordered this encounter  Medications  . bacitracin 500 UNIT/GM ointment    Sig: Apply 1 application topically 3 (three) times daily. To umbilicus area    Dispense:  15 g    Refill:  0    Order Specific Question:   Supervising Provider    Answer:   Brunetta Genera [[id number]]    THERAPY PLAN:  Referral for port placement and plan to start palliative systemic chemotherapy once port is placed.  Goals of care and Code status discussion will need to take place moving forward.  All questions were answered. The patient knows to call the clinic with any problems, questions or concerns. We can  certainly see the patient much sooner if necessary.  Patient and plan discussed with Dr. Twana First and she is in agreement with the aforementioned.   This note is electronically signed by: Doy Mince 04/28/2016 5:56 PM

## 2016-04-28 NOTE — Progress Notes (Signed)
START ON PATHWAY REGIMEN - Pancreatic     A cycle is every 28 days:     Nab-paclitaxel (protein bound)      Gemcitabine   **Always confirm dose/schedule in your pharmacy ordering system**    Patient Characteristics: Adenocarcinoma, Metastatic Disease, First Line, PS >= 2 Histology: Adenocarcinoma Current evidence of distant metastases? Yes AJCC T Category: T4 AJCC N Category: N1 AJCC M Category: M1 AJCC 8 Stage Grouping: IV Line of therapy: First Line Would you be surprised if this patient died  in the next year? I would NOT be surprised if this patient died in the next year  Intent of Therapy: Non-Curative / Palliative Intent, Discussed with Patient

## 2016-04-28 NOTE — Patient Instructions (Signed)
[place] at [facility] Discharge Instructions  RECOMMENDATIONS MADE BY THE CONSULTANT AND ANY TEST RESULTS WILL BE SENT TO YOUR REFERRING PHYSICIAN.  You saw Kirby Crigler, PA-C, today. You will be referred to dr. Arnoldo Morale for a port a cath placement. You will be set up for chemotherapy teaching. Cream for your belly button will be called in to your pharmacy after lunch. You will start chemo after you have your port placed. Follow up on Day 8 of cycle 1. See Amy at checkout for appointments.  Thank you for choosing [place] at [facility] to provide your oncology and hematology care.  To afford each patient quality time with our provider, please arrive at least 15 minutes before your scheduled appointment time.    If you have a lab appointment with the [place] please come in thru the  Main Entrance and check in at the main information desk  You need to re-schedule your appointment should you arrive 10 or more minutes late.  We strive to give you quality time with our providers, and arriving late affects you and other patients whose appointments are after yours.  Also, if you no show three or more times for appointments you may be dismissed from the clinic at the providers discretion.     Again, thank you for choosing [facility].  Our hope is that these requests will decrease the amount of time that you wait before being seen by our physicians.       _____________________________________________________________  Should you have questions after your visit to [facility], please contact our office at [phone] between the hours of 8:30 a.m. and 4:30 p.m.  Voicemails left after 4:30 p.m. will not be returned until the following business day.  For prescription refill requests, have your pharmacy contact our office.       Resources For Cancer Patients and their Caregivers ? American Cancer Society: Can  assist with transportation, wigs, general needs, runs Look Good Feel Better.        216-783-0564 ? Cancer Care: Provides financial assistance, online support groups, medication/co-pay assistance.  1-800-813-HOPE 216 078 9480) ? [place] Assists [place] cancer patients and their families through emotional , educational and financial support.  (980)417-5593 ? [place] DSS Where to apply for food stamps, Medicaid and utility assistance. 832-381-6273 ? [facility]: Transportation to medical appointments. 541-009-1894 ? Social Security Administration: May apply for disability if have a Stage IV cancer. (303)065-6898 872 366 7176 ? [employer], Disability and Transit Services: Assists with nutrition, care and transit needs. [place] Support Programs: @10RELATIVEDAYS @ > Cancer Support Group  2nd Tuesday of the month 1pm-2pm, Journey Room  > Creative Journey  3rd Tuesday of the month 1130am-1pm, Journey Room  > Look Good Feel Better  1st Wednesday of the month 10am-12 noon, Journey Room (Call American Cancer Society to register 614-675-0832)    Implanted [facility] Guide An implanted port is a type of central line that is placed under the skin. Central lines are used to provide IV access when treatment or nutrition needs to be given through a person's veins. Implanted ports are used for long-term IV access. An implanted port may be placed because:  You need IV medicine that would be irritating to the small veins in your hands or arms.  You need long-term IV medicines, such as antibiotics.  You need IV nutrition for a long period.  You need frequent blood draws for lab tests.  You need dialysis. Implanted ports are usually placed in the chest area, but they can also be placed in the upper arm, the abdomen, or the leg. An implanted port has two main parts:  Reservoir. The reservoir is round and will appear as a small, raised area  under your skin. The reservoir is the part where a needle is inserted to give medicines or draw blood.  Catheter. The catheter is a thin, flexible tube that extends from the reservoir. The catheter is placed into a large vein. Medicine that is inserted into the reservoir goes into the catheter and then into the vein. How will I care for my incision site? Do not get the incision site wet. Bathe or shower as directed by your health care provider. How is my port accessed? Special steps must be taken to access the port:  Before the port is accessed, a numbing cream can be placed on the skin. This helps numb the skin over the port site.  Your health care provider uses a sterile technique to access the port.  Your health care provider must put on a mask and sterile gloves.  The skin over your port is cleaned carefully with an antiseptic and allowed to dry.  The port is gently pinched between sterile gloves, and a needle is inserted into the port.  Only "non-coring" port needles should be used to access the port. Once the port is accessed, a blood return should be checked. This helps ensure that the port is in the vein and is not clogged.  If your port needs to remain accessed for a constant infusion, a clear (transparent) bandage will be placed over the needle site. The bandage and needle will need to be changed every week, or as directed by your health care provider.  Keep the bandage covering the needle clean and dry. Do not get it wet. Follow your health care provider's instructions on how to take a shower or bath while the port is accessed.  If your port does not need to stay accessed, no bandage is needed over the port. What is flushing? Flushing helps keep the port from getting clogged. Follow your health care provider's instructions on how and when to flush the port. Ports are usually flushed with saline solution or a medicine called heparin. The need for flushing will depend on how the  port is used.  If the port is used for intermittent medicines or blood draws, the port will need to be flushed:  After medicines have been given.  After blood has been drawn.  As part of routine maintenance.  If a constant infusion is running, the port may not need to be flushed. How long will my port stay implanted? The port can stay in for as long as your health care provider thinks it is needed. When it is time for the port to come out, surgery will be done to remove it. The procedure is similar to the one performed when the port was put in. When should I seek immediate medical care? When you have an implanted port, you should seek immediate medical care if:  You notice a bad smell coming from the incision site.  You have swelling, redness, or drainage at the incision site.  You have more swelling or pain at the port site or the surrounding area.  You have a fever that is not controlled with medicine. This information is not intended to replace advice  given to you by your health care provider. Make sure you discuss any questions you have with your health care provider. Document Released: 01/23/2005 Document Revised: 07/01/2015 Document Reviewed: 09/30/2012 Elsevier Interactive Patient Education  2017 [place].   Chemotherapy Chemotherapy is the use of medicines to stop or slow the growth of cancer cells. Depending on the type and stage of your cancer, you may have chemotherapy to:  Cure your cancer.  Slow the progression of your cancer.  Ease your cancer symptoms.  Improve the benefits of radiation treatment.  Shrink a tumor before surgery.  Rid the body of cancer cells that remain after a tumor is surgically removed. How is chemotherapy given? Chemotherapy may be given:  By mouth in liquid or pill form.  Through a thin tube that is inserted into a vein or artery.  By getting a shot.  By rubbing a cream or ointment on your skin.  Through liquids that are  placed directly into various areas of the body, such as the abdomen, chest, or bladder. How often is chemotherapy given? Chemotherapy may be given continuously over time, or it may be given in cycles. For example, you may take the medicine for one week out of every month. For how long will I need chemotherapy treatments? The length of treatment depends on many factors, including:  The type of cancer.  Whether the cancer has spread.  How you respond to the chemotherapy.  Whether you develop side effects. Some types of chemotherapy medicine are given only one time. Others are given for months, years, or for life. What safety precautions must I take while on chemotherapy? Chemotherapy medicines are very strong. They will be in all of your bodily fluids, including your urine, stool, saliva, sweat, tears, vaginal secretions, and semen. You must carefully follow some safety precautions to prevent harm to others while you are using these medicines. Here are some recommended precautions:  Make sure that people who help care for you wear disposable gloves if they are going to come into contact with any of your bodily fluids. Women who are pregnant or breastfeeding should not handle any of your bodily fluids.  Wash any clothes, towels, and linens that may have your bodily fluids on them twice in a washing machine using very hot water.  Dispose of adult diapers, tampons, and sanitary napkins by first sealing them in a plastic bag.  Use a condom when having sex for at least 2 weeks after receiving your chemotherapy.  Do not share beverages or food.  Keep your chemotherapy medicines in their original bottles. Keep them in a high, safe location, away from children. Do not expose them to heat or moisture. Do not put them in containers with other types of medicines.  Dispose of all wrappers for your chemotherapy medicines by sealing them in a separate plastic bag.  Do not throw away extra medicine,  and do not flush it down the toilet. Take medicine that you are not going to use to your health care provider's office where it can be disposed of properly.  Follow your health care provider's directions for the proper disposal of needles, IV tubing, and other medical supplies that have come into contact with your chemotherapy medicines.  If you are issued a hazardous waste container, make sure you understand the directions for using it.  Wash your hands thoroughly with warm water and soap after using the bathroom. Dry your hands with disposable paper towels.  When using the toilet:  Flush it twice after each use, including after vomiting.  Close the lid of the toilet prior to flushing. This helps to avoid splashing.  Both men and women should sit to use the toilet. This helps avoid splashing. What are the side effects of chemotherapy? Side effects depend on a variety of factors, including:  The specific type of chemotherapy medicine used.  The dosage.  How long the medicine is used for.  Your overall health. Some of the side effects you may experience include:  Fatigue and decreased energy.  Decreased appetite.  Changes in your sense of smell or taste.  Nausea.  Vomiting.  Constipation or diarrhea.  Hair loss.  Increased susceptibility to infection.  Easy bleeding.  Mouth sores.  Burning or tingling in the hands or feet.  Memory problems. This information is not intended to replace advice given to you by your health care provider. Make sure you discuss any questions you have with your health care provider. Document Released: 11/20/2006 Document Revised: 08/13/2015 Document Reviewed: 07/01/2013 Elsevier Interactive Patient Education  2017 [employer].

## 2016-05-10 ENCOUNTER — Telehealth (HOSPITAL_COMMUNITY): Payer: Self-pay

## 2016-05-10 NOTE — Telephone Encounter (Signed)
Patient called wanting to know more about her stage IV pancreatic cancer. She wants to know if there are other options besides chemotherapy. Explained to patient that the only options are chemo or Hospice care. Patient has talked to several people about chemotherapy. She also has other issues which contribute to how well she will tolerate treatment. She doesn't have a lot of family support nor can she drive. She has to go to [facility] for injections in her eye and states coming here 3 times a week would be extremely difficult if not impossible. I explained to patient that sometimes the treatment is harder to tolerate than the disease. She states she had heard that from other people. She does want to learn more about Stage IV pancreatic cancer but does not want to persue chemotherapy at this time. She states God is going to heal her. She has "claimed healing in His name." She states God is already healing her because the area around her belly button is healing. Explained to patient I would get her an appointment to talk with the provider again and cancel the port a cath insertion appointment for her. Patient verbalized understanding and expressed her thanks. Message forwarded to providers and scheduling.

## 2016-05-15 ENCOUNTER — Encounter (HOSPITAL_COMMUNITY): Payer: Self-pay | Admitting: Lab

## 2016-05-15 ENCOUNTER — Encounter (HOSPITAL_COMMUNITY): Payer: Medicare Other | Attending: Oncology | Admitting: Oncology

## 2016-05-15 VITALS — BP 138/43 | HR 56 | Temp 98.5°F | Resp 20 | Wt 180.7 lb

## 2016-05-15 DIAGNOSIS — C801 Malignant (primary) neoplasm, unspecified: Secondary | ICD-10-CM | POA: Diagnosis not present

## 2016-05-15 DIAGNOSIS — D508 Other iron deficiency anemias: Secondary | ICD-10-CM | POA: Insufficient documentation

## 2016-05-15 DIAGNOSIS — C259 Malignant neoplasm of pancreas, unspecified: Secondary | ICD-10-CM

## 2016-05-15 DIAGNOSIS — R1905 Periumbilic swelling, mass or lump: Secondary | ICD-10-CM

## 2016-05-15 NOTE — Progress Notes (Unsigned)
Referral to [facility]. Records faxed on 4/9

## 2016-05-15 NOTE — Patient Instructions (Addendum)
[place] at [facility] Discharge Instructions  RECOMMENDATIONS MADE BY THE CONSULTANT AND ANY TEST RESULTS WILL BE SENT TO YOUR REFERRING PHYSICIAN.   We are referring you to Hospice.   You may return to [place] as needed if you want to.  Thank you for choosing [place] at [facility] to provide your oncology and hematology care.  To afford each patient quality time with our provider, please arrive at least 15 minutes before your scheduled appointment time.    If you have a lab appointment with the [facility] please come in thru the  Main Entrance and check in at the main information desk  You need to re-schedule your appointment should you arrive 10 or more minutes late.  We strive to give you quality time with our providers, and arriving late affects you and other patients whose appointments are after yours.  Also, if you no show three or more times for appointments you may be dismissed from the clinic at the providers discretion.     Again, thank you for choosing [facility].  Our hope is that these requests will decrease the amount of time that you wait before being seen by our physicians.       _____________________________________________________________  Should you have questions after your visit to [facility], please contact our office at [phone] between the hours of 8:30 a.m. and 4:30 p.m.  Voicemails left after 4:30 p.m. will not be returned until the following business day.  For prescription refill requests, have your pharmacy contact our office.       Resources For Cancer Patients and their Caregivers ? American Cancer Society: Can assist with transportation, wigs, general needs, runs Look Good Feel Better.        (574)266-7715 ? Cancer Care: Provides financial assistance, online support groups, medication/co-pay assistance.  1-800-813-HOPE 831-173-6951) ? [place] Assists [place] cancer patients and their families through emotional , educational and financial support.  769-356-6089 ? [place] DSS Where to apply for food stamps, Medicaid and utility assistance. (864)547-1044 ? [facility]: Transportation to medical appointments. 769 784 2351 ? Social Security Administration: May apply for disability if have a Stage IV cancer. (575)085-2238 708-677-4251 ? [employer], Disability and Transit Services: Assists with nutrition, care and transit needs. [place] Support Programs: @10RELATIVEDAYS @ > Cancer Support Group  2nd Tuesday of the month 1pm-2pm, Journey Room  > Creative Journey  3rd Tuesday of the month 1130am-1pm, Journey Room  > Look Good Feel Better  1st Wednesday of the month 10am-12 noon, Journey Room (Call [place] to register [phone])

## 2016-05-15 NOTE — Progress Notes (Signed)
[place]  PROGRESS NOTE  Patient Care Team: [name], NP as PCP - General (Nurse Practitioner) Danie Binder, MD as Consulting Physician (Gastroenterology)  CHIEF COMPLAINTS/PURPOSE OF CONSULTATION:  Adenocarcinoma of upper GI origin, anemia  HISTORY OF PRESENTING ILLNESS:  Kelsey Palmer 83 y.o. female is here for follow up of adenocarcinoma revealed on biopsy of the umbilicus. She was seen by Dr. Barney Drain. CT imaging has been performed which notes a pancreatic mass in the tail of the pancreas.   She is here for follow up with her friend. She has been doing well. She denies chest pain, shortness of breath, or abdominal pain. She reports normal bowel movements. She reports some discomfort related to arthritis. She reports a fair energy level at this time.   On her last visit patient stated that she was interested in chemotherapy therefore gemzar/abraxane was ordered. However patient stated that she has changed her mind is no longer interested in chemotherapy and "leave it up to the lord".  MEDICAL HISTORY:  Past Medical History:  Diagnosis Date  . Adenocarcinoma of pancreas ([place]) 04/05/2016  . Anemia   . CAD S/P percutaneous coronary angioplasty 12/15- 1/16   OM3, staged LAD/RCA  . Chronic renal disease, stage III   . Contrast media allergy Dec 2015  . Dyslipidemia    statin intol  . GERD (gastroesophageal reflux disease)   . Hypertension   . Ischemic cardiomyopathy Dec 2015   EF 45-50%  . NSTEMI (non-ST elevated myocardial infarction) ([place]) [place] 17-May-2013   cath done in [place]  . Obesity (BMI 30-39.9)   . Shingles   . Type 2 diabetes mellitus with renal manifestations ([place])     SURGICAL HISTORY: Past Surgical History:  Procedure Laterality Date  . ABDOMINAL HYSTERECTOMY    . CARDIAC CATHETERIZATION  Dec 2015/Jan 2016   First procedure had allergic reaction to dye, had to do again in Jan 2016  . CHOLECYSTECTOMY      SOCIAL HISTORY: Social  History   Social History  . Marital status: Widowed    Spouse name: N/A  . Number of children: N/A  . Years of education: N/A   Occupational History  . Not on file.   Social History Main Topics  . Smoking status: Never Smoker  . Smokeless tobacco: Never Used  . Alcohol use No  . Drug use: No  . Sexual activity: Not on file   Other Topics Concern  . Not on file   Social History Narrative   RETIRED [employer]. LIVES WITH BROTHER. THEY TAKE CARE OF EACH OTHER. NO CHILDREN.   Social Hx: Lives with her brother No children- but raised her siblings She is the 6th of 50 siblings  30 are still living. Nonsmoker Hobbies: puzzles  FAMILY HISTORY: Family History  Problem Relation Age of Onset  . Colon cancer Neg Hx    Family Hx: Mom- 34 died- hx of diabetes, HTN Dad- 75 when he died- stroke 17 siblings in the family.  Brother in 05-17-2013 died of mouth cancer 1 Brother living- prostrate cancer 1 Sister alive, diagnosed with Dm, HTN Nephew -diagnosed with 2 types of cancer- doesn't know which kinds. Niece had a brain tumor  ALLERGIES:  is allergic to coconut oil; statins; tomato; clopidogrel; contrast media [iodinated diagnostic agents]; and orange oil.  MEDICATIONS:  Current Outpatient Prescriptions  Medication Sig Dispense Refill  . albuterol (PROVENTIL HFA;VENTOLIN HFA) 108 (90 Base) MCG/ACT inhaler     .  amLODipine (NORVASC) 5 MG tablet Take 1 tablet by mouth daily.    . Artificial Tear Ointment (DRY EYES OP) Apply 2 drops to eye daily as needed (dry eyes).    Marland Kitchen aspirin EC 81 MG tablet Take 81 mg by mouth daily.    . bacitracin 500 UNIT/GM ointment Apply 1 application topically 3 (three) times daily. To umbilicus area 15 g 0  . BRILINTA 90 MG TABS tablet Take 1 tablet by mouth 2 (two) times daily.    . clobetasol ointment (TEMOVATE) 1.61 % Apply 1 application topically daily as needed (rash).     . furosemide (LASIX) 40 MG tablet Take 1 tablet by mouth daily.    Marland Kitchen  gabapentin (NEURONTIN) 300 MG capsule Take 1 capsule by mouth every evening.     Marland Kitchen HYDROcodone-acetaminophen (LORTAB) 7.5-500 MG tablet     . HYDROcodone-acetaminophen (NORCO/VICODIN) 5-325 MG tablet Take 1 tablet by mouth every 4 (four) hours as needed for moderate pain. 60 tablet 0  . insulin regular (NOVOLIN R,HUMULIN R) 100 units/mL injection 5 Units 2 (two) times daily before a meal.     . LEVEMIR FLEXTOUCH 100 UNIT/ML Pen Inject 22 Units as directed daily.    Marland Kitchen losartan (COZAAR) 50 MG tablet Take 1 tablet by mouth daily.    . meclizine (ANTIVERT) 25 MG tablet Take 25 mg by mouth 3 (three) times daily as needed (veritgo).    . metoprolol succinate (TOPROL-XL) 25 MG 24 hr tablet Take 1 tablet by mouth daily.    Marland Kitchen omeprazole (PRILOSEC) 20 MG capsule Take 1 capsule by mouth daily.    . pioglitazone (ACTOS) 30 MG tablet Take 1 tablet by mouth daily.    . prednisoLONE acetate (PRED FORTE) 1 % ophthalmic suspension     . Vitamin D, Ergocalciferol, (DRISDOL) 50000 units CAPS capsule Take 50,000 Units by mouth every 7 (seven) days.     No current facility-administered medications for this visit.     Review of Systems  Constitutional: Negative.  Negative for malaise/fatigue.  HENT: Positive for hearing loss.   Eyes: Negative.   Respiratory: Negative.  Negative for cough, shortness of breath and wheezing.   Cardiovascular: Negative.  Negative for chest pain.  Gastrointestinal: Positive for abdominal pain (at [employer]). Negative for constipation and diarrhea.       No pain after eating  Genitourinary: Negative.   Musculoskeletal: Positive for joint pain (Related to arthritis.).  Skin: Negative.   Neurological: Negative.  Negative for weakness.  Endo/Heme/Allergies: Negative.   Psychiatric/Behavioral: Negative.   All other systems reviewed and are negative. 14 point ROS was done and is otherwise as detailed above or in HPI  PHYSICAL EXAMINATION: ECOG PERFORMANCE STATUS: 2 - Symptomatic, <50%  confined to bed  Vitals:   05/15/16 1323  BP: (!) 138/43  Pulse: (!) 56  Resp: 20  Temp: 98.5 F (36.9 C)   Filed Weights   05/15/16 1323  Weight: 180 lb 11.2 oz (82 kg)   Physical Exam  Constitutional: She is oriented to person, place, and time and well-developed, well-nourished, and in no distress.  She is sitting in a wheelchair today.  HENT:  Head: Normocephalic and atraumatic.  Mouth/Throat: No oropharyngeal exudate.  She has dentures on the top teeth and partials on the bottom.    Eyes: Conjunctivae and EOM are normal. Pupils are equal, round, and reactive to light. No scleral icterus.  Neck: Normal range of motion. Neck supple.  Cardiovascular: Normal rate and regular rhythm.  Exam reveals no gallop and no friction rub.   No murmur heard. Pulmonary/Chest: Effort normal and breath sounds normal. No respiratory distress. She has no wheezes. She has no rales. She exhibits no tenderness.  Abdominal: Soft. Bowel sounds are normal. She exhibits no distension. There is no tenderness. There is no rebound and no guarding.  Musculoskeletal: Normal range of motion. She exhibits no edema.  Lymphadenopathy:    She has no cervical adenopathy.  Neurological: She is alert and oriented to person, place, and time. No cranial nerve deficit.  Skin: Skin is warm and dry.  Psychiatric: Mood, memory, affect and judgment normal.  Nursing note and vitals reviewed.  LABORATORY DATA:  I have reviewed the data as listed. Lab Results  Component Value Date   WBC 5.9 04/05/2016   HGB 9.8 (L) 04/05/2016   HCT 30.9 (L) 04/05/2016   MCV 92.0 04/05/2016   PLT 284 04/05/2016   CMP     Component Value Date/Time   NA 134 (L) 04/05/2016 1018   K 4.2 04/05/2016 1018   CL 101 04/05/2016 1018   CO2 28 04/05/2016 1018   GLUCOSE 246 (H) 04/05/2016 1018   BUN 18 04/05/2016 1018   CREATININE 1.05 (H) 04/05/2016 1018   CALCIUM 8.6 (L) 04/05/2016 1018   PROT 7.4 04/05/2016 1018   ALBUMIN 3.0 (L)  04/05/2016 1018   AST 21 04/05/2016 1018   ALT 16 04/05/2016 1018   ALKPHOS 74 04/05/2016 1018   BILITOT 0.3 04/05/2016 1018   GFRNONAA 48 (L) 04/05/2016 1018   GFRAA 56 (L) 04/05/2016 1018   RADIOGRAPHIC STUDIES: I have personally reviewed the radiological images as listed and agreed with the findings in the report.  Outside Report reviewed  PATHOLOGY: Outside results noted below:   ASSESSMENT & PLAN: Adenocarcinoma of upper GI origin Sister [name] Nodule Mass in tail of pancreas Anemia  PLAN: - patient states that she is not interested in chemotherapy.  - She states that she is currently asymptomatic and is not having any issues related to her pancreatic CA. I have encouraged the patient to enroll in home hospice so that a nurse can periodically evaluate her needs related to her underlying malignancy. The patient is in agreement, and I will refer her to these services. - I advised the patient to go to radiology to obtain copies of her scans. - The patient will be provided with materials on pancreatic cancer per her request. - RTC PRN; at this time we will turn over all care to hospice.   This document serves as a record of services personally performed by Twana First, MD. It was created on her behalf by Maryla Morrow, a trained medical scribe. The creation of this record is based on the scribe's personal observations and the provider's statements to them. This document has been checked and approved by the attending provider.  I have reviewed the above documentation for accuracy and completeness and I agree with the above.  This note was electronically signed.  Twana First, MD 05/15/2016 1:35 PM

## 2016-05-16 ENCOUNTER — Ambulatory Visit: Payer: Self-pay | Admitting: General Surgery

## 2017-03-08 ENCOUNTER — Observation Stay (HOSPITAL_COMMUNITY)
Admission: EM | Admit: 2017-03-08 | Discharge: 2017-03-10 | Disposition: A | Discharge status: Home / Self Care | Attending: Internal Medicine | Admitting: Internal Medicine

## 2017-03-08 ENCOUNTER — Encounter (HOSPITAL_COMMUNITY): Payer: Self-pay

## 2017-03-08 DIAGNOSIS — T148XXA Other injury of unspecified body region, initial encounter: Secondary | ICD-10-CM

## 2017-03-08 DIAGNOSIS — E1122 Type 2 diabetes mellitus with diabetic chronic kidney disease: Secondary | ICD-10-CM | POA: Insufficient documentation

## 2017-03-08 DIAGNOSIS — D500 Iron deficiency anemia secondary to blood loss (chronic): Secondary | ICD-10-CM | POA: Diagnosis not present

## 2017-03-08 DIAGNOSIS — D62 Acute posthemorrhagic anemia: Secondary | ICD-10-CM

## 2017-03-08 DIAGNOSIS — I252 Old myocardial infarction: Secondary | ICD-10-CM | POA: Diagnosis not present

## 2017-03-08 DIAGNOSIS — I2510 Atherosclerotic heart disease of native coronary artery without angina pectoris: Secondary | ICD-10-CM | POA: Diagnosis not present

## 2017-03-08 DIAGNOSIS — C259 Malignant neoplasm of pancreas, unspecified: Secondary | ICD-10-CM | POA: Diagnosis not present

## 2017-03-08 DIAGNOSIS — R58 Hemorrhage, not elsewhere classified: Principal | ICD-10-CM | POA: Insufficient documentation

## 2017-03-08 DIAGNOSIS — R1907 Generalized intra-abdominal and pelvic swelling, mass and lump: Secondary | ICD-10-CM | POA: Insufficient documentation

## 2017-03-08 DIAGNOSIS — I129 Hypertensive chronic kidney disease with stage 1 through stage 4 chronic kidney disease, or unspecified chronic kidney disease: Secondary | ICD-10-CM | POA: Insufficient documentation

## 2017-03-08 DIAGNOSIS — R1900 Intra-abdominal and pelvic swelling, mass and lump, unspecified site: Secondary | ICD-10-CM | POA: Diagnosis present

## 2017-03-08 DIAGNOSIS — Z794 Long term (current) use of insulin: Secondary | ICD-10-CM | POA: Insufficient documentation

## 2017-03-08 DIAGNOSIS — N183 Chronic kidney disease, stage 3 unspecified: Secondary | ICD-10-CM | POA: Diagnosis present

## 2017-03-08 DIAGNOSIS — Z7982 Long term (current) use of aspirin: Secondary | ICD-10-CM | POA: Insufficient documentation

## 2017-03-08 DIAGNOSIS — Z79899 Other long term (current) drug therapy: Secondary | ICD-10-CM | POA: Insufficient documentation

## 2017-03-08 DIAGNOSIS — K219 Gastro-esophageal reflux disease without esophagitis: Secondary | ICD-10-CM | POA: Diagnosis present

## 2017-03-08 DIAGNOSIS — D509 Iron deficiency anemia, unspecified: Secondary | ICD-10-CM | POA: Diagnosis present

## 2017-03-08 DIAGNOSIS — I10 Essential (primary) hypertension: Secondary | ICD-10-CM | POA: Diagnosis present

## 2017-03-08 DIAGNOSIS — D649 Anemia, unspecified: Secondary | ICD-10-CM

## 2017-03-08 DIAGNOSIS — Z515 Encounter for palliative care: Secondary | ICD-10-CM

## 2017-03-08 HISTORY — DX: Malignant neoplasm of pancreas, unspecified: C25.9

## 2017-03-08 MED ORDER — LIDOCAINE-EPINEPHRINE (PF) 2 %-1:200000 IJ SOLN
INTRAMUSCULAR | Status: AC
  Administered 2017-03-08: 20 mL via INTRADERMAL
  Filled 2017-03-08: qty 20

## 2017-03-08 MED ORDER — LIDOCAINE-EPINEPHRINE 2 %-1:100000 IJ SOLN
20.0000 mL | Freq: Once | INTRAMUSCULAR | Status: DC
  Filled 2017-03-08: qty 20

## 2017-03-08 MED ORDER — SILVER NITRATE-POT NITRATE 75-25 % EX MISC
CUTANEOUS | Status: AC
  Administered 2017-03-08: 1 via TOPICAL
  Filled 2017-03-08: qty 1

## 2017-03-08 NOTE — ED Notes (Addendum)
Nilda Simmer, RN with hospice states that the pt is seen by a Dr. Jewel Baize with [facility]. Per Ms. Rolland Porter, RN, the pt had been bleeding earlier in the day from a tumor near her umbilicus. Ms. Rolland Porter, RN states that she made a PRN visit to the pt's house due to family members stating that they could not get the area to stop bleeding. This RN used 6-7 silver nitrate sticks and bleeding subsided. Family was concerned about how much blood the pt had lost throughout the day. Family requested that pt to be brought to the ED and according to the faxed report, the pt was also concerned about the amt of bleeding and stated. "I do not want to continue bleeding like this."  Dr. Gilford Rile did inform the hospice nurse to notify the family, that this pt may not stop bleeding and could eventually bleed out. Family members/caregivers were advised to have dark towels available at home.   Niece-POA-Desma [phone]- is in the room with pt at this time.

## 2017-03-08 NOTE — ED Notes (Signed)
EDP in with pt 

## 2017-03-08 NOTE — ED Provider Notes (Addendum)
[facility] EMERG[facility] DE[facility]MENT Provider Note   CSN: [id number] Arrival date & time: 03/08/17  2315     History   Chief Complaint Chief Complaint  Patient presents with  . Bleeding from [place]    HPI Kelsey [facility] is a 84 y.o. female.  The history is provided by the patient.  She has history of adenocarcinoma of the pancreas, coronary artery disease status post non-STEMI and PTCA, chronic renal disease stage III, hyperlipidemia, gastroesophageal reflux disease, diabetes.  She has had a lesion of her abdominal wall which was how her cancer was first identified.  Over the last 2 months, she has been having intermittent bleeding from it.  Tonight, she had the worst episode of bleeding which was difficult to control at home.  There is some mild burning in the lesion but it is not painful.  She had declined chemotherapy.  Of note, she does take aspirin and Brilinta because of her coronary artery disease.  She is not on any systemic anticoagulants.  Past Medical History:  Diagnosis Date  . Adenocarcinoma of pancreas ([place]) 04/05/2016  . Anemia   . CAD S/P percutaneous coronary angioplasty 12/15- 1/16   OM3, staged LAD/RCA  . Chronic renal disease, stage III ([place])   . Contrast media allergy Dec 2015  . Dyslipidemia    statin intol  . GERD (gastroesophageal reflux disease)   . Hypertension   . Ischemic cardiomyopathy Dec 2015   EF 45-50%  . NSTEMI (non-ST elevated myocardial infarction) ([place]) [place] 2015   cath done in [place]  . Obesity (BMI 30-39.9)   . Pancreatic cancer ([place])   . Shingles   . Type 2 diabetes mellitus with renal manifestations [facility])     Patient Active Problem List   Diagnosis Date Noted  . Adenocarcinoma of pancreas ([place]) 04/05/2016  . Pancreatic adenoma 04/05/2016  . Obesity BMI 37 09/08/2014  . Dyslipidemia-statin intol 09/08/2014  . Chronic renal insufficiency, stage III (moderate) ([place]) 09/08/2014  . Contrast media allergy 09/08/2014  . Type 2 diabetes  mellitus with renal manifestations ([place]) 09/07/2014  . Essential hypertension 09/07/2014  . CAD S/P DES Dec 2015- Jan 2016 09/07/2014  . Anemia, iron deficiency 09/07/2014  . Heme positive stool   . GERD (gastroesophageal reflux disease) 01/15/2014  . HLD (hyperlipidemia) 01/15/2014  . CAD (coronary artery disease) 01/15/2014  . AKI (acute kidney injury) ([place]) 01/15/2014    Past Surgical History:  Procedure Laterality Date  . ABDOMINAL HYSTERECTOMY    . CARDIAC CATHETERIZATION  Dec 2015/Jan 2016   First procedure had allergic reaction to dye, had to do again in Jan 2016  . CHOLECYSTECTOMY      OB History    No data available       Home Medications    Prior to Admission medications   Medication Sig Start Date End Date Taking? Authorizing Provider  albuterol (PROVENTIL HFA;VENTOLIN HFA) 108 (90 Base) MCG/ACT inhaler  04/26/09   [provider]  amLODipine (NORVASC) 5 MG tablet Take 1 tablet by mouth daily. 06/22/14   [provider]  Artificial Tear Ointment (DRY EYES OP) Apply 2 drops to eye daily as needed (dry eyes).    [provider]  aspirin EC 81 MG tablet Take 81 mg by mouth daily.    [provider]  bacitracin 500 UNIT/GM ointment Apply 1 application topically 3 (three) times daily. To umbilicus area 08/02/01   Baird Cancer, PA-C  BRILINTA 90 MG TABS tablet Take 1 tablet  by mouth 2 (two) times daily. 08/20/14   [provider]  clobetasol ointment (TEMOVATE) 2.95 % Apply 1 application topically daily as needed (rash).  07/14/14   [provider]  furosemide (LASIX) 40 MG tablet Take 1 tablet by mouth daily. 08/20/14   [provider]  gabapentin (NEURONTIN) 300 MG capsule Take 1 capsule by mouth every evening.  08/04/14   [provider]  HYDROcodone-acetaminophen (LORTAB) 7.5-500 MG tablet  06/25/09   [provider]  HYDROcodone-acetaminophen (NORCO/VICODIN) 5-325 MG tablet Take 1 tablet by  mouth every 4 (four) hours as needed for moderate pain. 03/02/16   [place], Kelby Fam, MD  insulin regular (NOVOLIN R,HUMULIN R) 100 units/mL injection 5 Units 2 (two) times daily before a meal.  03/17/09   [provider]  LEVEMIR FLEXTOUCH 100 UNIT/ML Pen Inject 22 Units as directed daily. 06/22/14   [provider]  losartan (COZAAR) 50 MG tablet Take 1 tablet by mouth daily. 06/22/14   [provider]  meclizine (ANTIVERT) 25 MG tablet Take 25 mg by mouth 3 (three) times daily as needed (veritgo).    [provider]  metoprolol succinate (TOPROL-XL) 25 MG 24 hr tablet Take 1 tablet by mouth daily. 07/13/14   [provider]  omeprazole (PRILOSEC) 20 MG capsule Take 1 capsule by mouth daily. 08/20/14   [provider]  pioglitazone (ACTOS) 30 MG tablet Take 1 tablet by mouth daily. 08/06/14   [provider]  prednisoLONE acetate (PRED FORTE) 1 % ophthalmic suspension  11/30/09   [provider]  Vitamin D, Ergocalciferol, (DRISDOL) 50000 units CAPS capsule Take 50,000 Units by mouth every 7 (seven) days.    [provider]    Family History Family History  Problem Relation Age of Onset  . Colon cancer Neg Hx     Social History Social History   Tobacco Use  . Smoking status: Never Smoker  . Smokeless tobacco: Never Used  Substance Use Topics  . Alcohol use: No  . Drug use: No     Allergies   Coconut oil; Statins; Tomato; Clopidogrel; Contrast media [iodinated diagnostic agents]; and Orange oil   Review of Systems Review of Systems  All other systems reviewed and are negative.    Physical Exam Updated Vital Signs BP (!) 150/44 (BP Location: Left Arm)   Pulse 66   Temp 98.1 F (36.7 C) (Oral)   Resp 16   SpO2 98%   Physical Exam  Nursing note and vitals reviewed.  84 year old female, resting comfortably and in no acute distress. Vital signs are significant for elevated blood pressure. Oxygen  saturation is 98%, which is normal. Head is normocephalic and atraumatic. PERRLA, EOMI. Oropharynx is clear. Neck is nontender and supple without adenopathy or JVD. Back is nontender and there is no CVA tenderness. Lungs are clear without rales, wheezes, or rhonchi. Chest is nontender. Heart has regular rate and rhythm without murmur. Abdomen: Indurated skin lesion noted in the central abdomen 6 cm x 9.5 cm with area of bleeding noted in the superior aspect. Extremities have no cyanosis or edema, full range of motion is present. Skin is warm and dry without rash. Neurologic: Mental status is normal, cranial nerves are intact, there are no motor or sensory deficits.  ED Treatments / Results   Procedures Cauterization Date/Time: 03/08/2017 11:45 PM Performed by: Delora Fuel, MD Authorized by: Delora Fuel, MD  Consent: Verbal consent obtained. Written consent not obtained. Risks and benefits:  risks, benefits and alternatives were discussed Consent given by: patient Patient understanding: patient states understanding of the procedure being performed Patient consent: the patient's understanding of the procedure matches consent given Procedure consent: procedure consent matches procedure scheduled Relevant documents: relevant documents present and verified Site marked: the operative site was marked Required items: required blood products, implants, devices, and special equipment available Patient identity confirmed: verbally with patient and arm band Time out: Immediately prior to procedure a "time out" was called to verify the correct patient, procedure, equipment, support staff and site/side marked as required. Preparation: Patient was prepped and draped in the usual sterile fashion. Local anesthesia used: yes Anesthesia: local infiltration  Anesthesia: Local anesthesia used: yes Local Anesthetic: lidocaine 2% with epinephrine  Sedation: Patient sedated: no  Patient tolerance:  Patient tolerated the procedure well with no immediate complications Comments: Bleeding site was cauterized with complete control of bleeding.     Medications Ordered in ED Medications  lidocaine-EPINEPHrine (XYLOCAINE W/EPI) 2 %-1:100000 (with pres) injection 20 mL (not administered)  silver nitrate applicators 52-84 % applicator (not administered)  lidocaine-EPINEPHrine (XYLOCAINE W/EPI) 2 %-1:200000 (PF) injection (not administered)   Results for orders placed or performed during the [facility] encounter of 03/08/17  Hemoglobin and hematocrit, blood  Result Value Ref Range   Hemoglobin 6.4 (LL) 12.0 - 15.0 g/dL   HCT 21.7 (L) 36.0 - 46.0 %     Initial Impression / Assessment and Plan / ED Course  I have reviewed the triage vital signs and the nursing notes.  Skin metastasis of carcinoma of the pancreas with bleeding.  Old records are reviewed confirming diagnosis of metastatic pancreatic cancer and patient having declined chemotherapy.  Bleeding site is cauterized, but I have explained to patient that this is likely to be a temporary fix and other bleeding sites are likely to occur.  I have discussed with her that I feel she should talk with her oncologist to see if radiation therapy might be helpful to try to control the local bleeding.  Also, discussed with her cardiologist whether she needs to continue on her aspirin and Brilinta.  In the meantime, continue routine wound care.  Patient's niece has arrived and requested that hemoglobin be checked.  Hemoglobin has come back at 6.4.  Last hemoglobin on record was 9.8 on April 05, 2016.  Patient is hemodynamically stable and I feel the anemia is due to a combination of chronic disease and possible ongoing blood loss through bleeding from the skin.  She was offered the option of scheduled outpatient blood transfusion but prefers to stay in the emergency department to receive blood here.  She will be given 2 units of packed red blood cells  and discharged following that.  Final Clinical Impressions(s) / ED Diagnoses   Final diagnoses:  Bleeding from wound  Carcinoma of pancreas [facility])    ED Discharge Orders    None       Delora Fuel, MD 13/24/40 1027    Delora Fuel, MD 25/36/64 [phone]

## 2017-03-08 NOTE — ED Triage Notes (Signed)
Pt has history of pancreatic cancer, has a large area around her umbilicus that occasionally bleeds.  Tonight, the caregiver reported the area was bleeding and she couldn't get it to stop so sent pt here for eval.  Pt denies complaints

## 2017-03-09 ENCOUNTER — Telehealth: Payer: Self-pay | Admitting: Oncology

## 2017-03-09 ENCOUNTER — Ambulatory Visit
Admit: 2017-03-09 | Discharge: 2017-03-09 | Disposition: A | Discharge status: Home / Self Care | Payer: Medicare Other | Attending: Radiation Oncology | Admitting: Radiation Oncology

## 2017-03-09 ENCOUNTER — Encounter (HOSPITAL_COMMUNITY): Payer: Self-pay | Admitting: Family Medicine

## 2017-03-09 ENCOUNTER — Encounter: Payer: Self-pay | Admitting: Radiation Oncology

## 2017-03-09 ENCOUNTER — Ambulatory Visit
Admission: RE | Admit: 2017-03-09 | Discharge: 2017-03-09 | Disposition: A | Discharge status: Home / Self Care | Payer: Medicare Other | Source: Ambulatory Visit | Attending: Radiation Oncology | Admitting: Radiation Oncology

## 2017-03-09 ENCOUNTER — Other Ambulatory Visit: Payer: Self-pay | Admitting: Radiation Oncology

## 2017-03-09 DIAGNOSIS — R1905 Periumbilic swelling, mass or lump: Secondary | ICD-10-CM

## 2017-03-09 DIAGNOSIS — D62 Acute posthemorrhagic anemia: Secondary | ICD-10-CM

## 2017-03-09 DIAGNOSIS — C792 Secondary malignant neoplasm of skin: Secondary | ICD-10-CM | POA: Insufficient documentation

## 2017-03-09 DIAGNOSIS — Z515 Encounter for palliative care: Secondary | ICD-10-CM | POA: Diagnosis not present

## 2017-03-09 DIAGNOSIS — C259 Malignant neoplasm of pancreas, unspecified: Secondary | ICD-10-CM | POA: Diagnosis not present

## 2017-03-09 DIAGNOSIS — D500 Iron deficiency anemia secondary to blood loss (chronic): Secondary | ICD-10-CM | POA: Diagnosis not present

## 2017-03-09 DIAGNOSIS — R1900 Intra-abdominal and pelvic swelling, mass and lump, unspecified site: Secondary | ICD-10-CM | POA: Diagnosis present

## 2017-03-09 DIAGNOSIS — T148XXA Other injury of unspecified body region, initial encounter: Secondary | ICD-10-CM

## 2017-03-09 DIAGNOSIS — N183 Chronic kidney disease, stage 3 (moderate): Secondary | ICD-10-CM

## 2017-03-09 LAB — GLUCOSE, CAPILLARY: GLUCOSE-CAPILLARY: 167 mg/dL — AB (ref 65–99)

## 2017-03-09 LAB — HEMOGLOBIN AND HEMATOCRIT, BLOOD
HEMATOCRIT: 21.7 % — AB (ref 36.0–46.0)
HEMOGLOBIN: 6.4 g/dL — AB (ref 12.0–15.0)

## 2017-03-09 LAB — PREPARE RBC (CROSSMATCH)

## 2017-03-09 MED ORDER — LIDOCAINE-EPINEPHRINE (PF) 2 %-1:200000 IJ SOLN
20.0000 mL | Freq: Once | INTRAMUSCULAR | Status: AC
  Administered 2017-03-08: 20 mL via INTRADERMAL

## 2017-03-09 MED ORDER — ONDANSETRON HCL 4 MG/2ML IJ SOLN
4.0000 mg | Freq: Four times a day (QID) | INTRAMUSCULAR | Status: DC | PRN
  Administered 2017-03-09: 4 mg via INTRAVENOUS
  Filled 2017-03-09: qty 2

## 2017-03-09 MED ORDER — LOSARTAN POTASSIUM 50 MG PO TABS
100.0000 mg | ORAL_TABLET | Freq: Every day | ORAL | Status: DC
  Administered 2017-03-09 – 2017-03-10 (×2): 100 mg via ORAL
  Filled 2017-03-09 (×2): qty 2

## 2017-03-09 MED ORDER — PANTOPRAZOLE SODIUM 40 MG PO TBEC
40.0000 mg | DELAYED_RELEASE_TABLET | Freq: Every day | ORAL | Status: DC
Start: 2017-03-09 — End: 2017-03-10
  Administered 2017-03-09 – 2017-03-10 (×2): 40 mg via ORAL
  Filled 2017-03-09 (×2): qty 1

## 2017-03-09 MED ORDER — SODIUM CHLORIDE 0.9% FLUSH
3.0000 mL | Freq: Two times a day (BID) | INTRAVENOUS | Status: DC
  Administered 2017-03-09: 3 mL via INTRAVENOUS

## 2017-03-09 MED ORDER — HYDROCODONE-ACETAMINOPHEN 5-325 MG PO TABS
1.0000 | ORAL_TABLET | ORAL | Status: DC | PRN
Start: 2017-03-09 — End: 2017-03-09

## 2017-03-09 MED ORDER — ONDANSETRON HCL 4 MG PO TABS: 4.0000 mg | ORAL_TABLET | Freq: Four times a day (QID) | ORAL | Status: DC | PRN

## 2017-03-09 MED ORDER — SILVER NITRATE-POT NITRATE 75-25 % EX MISC
1.0000 | Freq: Once | CUTANEOUS | Status: AC
  Administered 2017-03-08: 1 via TOPICAL

## 2017-03-09 MED ORDER — SENNOSIDES-DOCUSATE SODIUM 8.6-50 MG PO TABS: 1.0000 | ORAL_TABLET | Freq: Every evening | ORAL | Status: DC | PRN

## 2017-03-09 MED ORDER — MECLIZINE HCL 25 MG PO TABS: 25.0000 mg | ORAL_TABLET | Freq: Three times a day (TID) | ORAL | Status: DC | PRN

## 2017-03-09 MED ORDER — ACETAMINOPHEN 325 MG PO TABS: 650.0000 mg | ORAL_TABLET | Freq: Four times a day (QID) | ORAL | Status: DC | PRN

## 2017-03-09 MED ORDER — METOPROLOL SUCCINATE ER 50 MG PO TB24
50.0000 mg | ORAL_TABLET | Freq: Every day | ORAL | Status: DC
  Administered 2017-03-09 – 2017-03-10 (×2): 50 mg via ORAL
  Filled 2017-03-09 (×2): qty 1

## 2017-03-09 MED ORDER — GABAPENTIN 300 MG PO CAPS
300.0000 mg | ORAL_CAPSULE | Freq: Every evening | ORAL | Status: DC
  Administered 2017-03-09: 300 mg via ORAL
  Filled 2017-03-09: qty 1

## 2017-03-09 MED ORDER — HYDROCODONE-ACETAMINOPHEN 5-325 MG PO TABS
1.0000 | ORAL_TABLET | ORAL | Status: DC | PRN
  Administered 2017-03-09: 2 via ORAL
  Administered 2017-03-10: 1 via ORAL
  Filled 2017-03-09 (×2): qty 2

## 2017-03-09 MED ORDER — SODIUM CHLORIDE 0.9 % IV SOLN: 250.0000 mL | INTRAVENOUS | Status: DC | PRN

## 2017-03-09 MED ORDER — ACETAMINOPHEN 650 MG RE SUPP: 650.0000 mg | Freq: Four times a day (QID) | RECTAL | Status: DC | PRN

## 2017-03-09 MED ORDER — SODIUM CHLORIDE 0.9 % IV SOLN
10.0000 mL/h | Freq: Once | INTRAVENOUS | Status: AC
  Administered 2017-03-09: 10 mL/h via INTRAVENOUS

## 2017-03-09 MED ORDER — SODIUM CHLORIDE 0.9% FLUSH: 3.0000 mL | INTRAVENOUS | Status: DC | PRN

## 2017-03-09 NOTE — ED Provider Notes (Signed)
Evaluated the patient at the bedside, she appears pale, she is in no distress and has no abdominal tenderness however this fungating area on the anterior abdominal wall continues to bleed, it appears to be a ongoing nonpulsatile bleeding which has soaked through several ABD pads.  I have asked general surgery to see the patient to see if there is any utility in further evaluation though this does appear to be terminal and the patient appears to be comfortable at this time.  Is no abdominal pain, no vomiting  8:37 AM  At 9:20 AM I discussed the case with Dr. Lisbeth Renshaw as the patient has continual bleeding, the general surgeon, Dr. Constance Haw has seen the patient and believes that this would be nonsurgical due to her DNR status and advanced cancer progression.  Will discuss with the hospitalist regarding transfer to [facility] long hospital so that Dr. Lisbeth Renshaw can further evaluate for the benefits of radiation therapy which she thinks may be able to help stop the bleeding.   D/w Dr. Wynetta Emery who will coordinate the transfer and care of the patient.  Final diagnoses:  Bleeding from wound  Carcinoma of pancreas ([place])  Anemia, unspecified type      Noemi Chapel, MD 03/09/17 [phone]

## 2017-03-09 NOTE — ED Notes (Signed)
Two RN's witness verbal consent for transport, signature pad not working

## 2017-03-09 NOTE — Consult Note (Signed)
Wise Nurse wound consult note Reason for Consult:Umbilical mass.  Bleeding controlled in Ed with Arista and Surgicel to wound.  MD orders nonremovable dressing.  Will defer to surgical services at this time.  Wound type: Cancerous Will not follow at this time.  Please re-consult if needed.  Domenic Moras RN BSN [place] Pager [phone]

## 2017-03-09 NOTE — ED Notes (Signed)
CRITICAL VALUE ALERT  Critical Value:  Hemoglobin 6.4  Date & Time Notied:  03/09/17 & 0102 hrs  Provider Notified: Dr. Roxanne Mins  Orders Received/Actions taken: N/A

## 2017-03-09 NOTE — Consult Note (Signed)
[facility] Consult  Reason for Consult: Bleeding from umbilical mass  Referring Physician: Dr. Sabra Heck   Chief Complaint    Bleeding from [place] is a 84 y.o. female.  HPI: Ms. Kelsey Palmer is an 84 yo who was diagnosed with metastatic pancreatic tail adenocarcinoma with a sister [name] node that has become a ulcerated bleeding mass in the umbilical. She first presented 03/02/2016 and was diagnosed later in the winter with biopsies.    She did not want to receive any chemoradiation or surgical treatment, and has been having hospice at home since April when she last saw oncology. Her medical power of attorney, her niece, is at the bedside, and reports that in the last 12 hrs the mass has been bleeding profusely and they came to the ED due to fears that she "might bleed out."   The ED has attempted silver nitrate and has dressed the wound, but it continues to bleed.  A Hgb was done and is low.   She is DNR and understands that this is the natural progression of her disease process.  She has not had imaging sine March 2018 for her cancer.   Past Medical History:  Diagnosis Date  . Adenocarcinoma of pancreas ([place]) 04/05/2016  . Anemia   . CAD S/P percutaneous coronary angioplasty 12/15- 1/16   OM3, staged LAD/RCA  . Chronic renal disease, stage III ([place])   . Contrast media allergy Dec 2015  . Dyslipidemia    statin intol  . GERD (gastroesophageal reflux disease)   . Hypertension   . Ischemic cardiomyopathy Dec 2015   EF 45-50%  . NSTEMI (non-ST elevated myocardial infarction) ([place]) [place] 2015   cath done in [place]  . Obesity (BMI 30-39.9)   . Pancreatic cancer ([place])   . Shingles   . Type 2 diabetes mellitus with renal manifestations [facility])     Past Surgical History:  Procedure Laterality Date  . ABDOMINAL HYSTERECTOMY    . CARDIAC CATHETERIZATION  Dec 2015/Jan 2016   First procedure had allergic reaction to dye, had to do again in Jan 2016  .  CHOLECYSTECTOMY      Family History  Problem Relation Age of Onset  . Colon cancer Neg Hx     Social History   Tobacco Use  . Smoking status: Never Smoker  . Smokeless tobacco: Never Used  Substance Use Topics  . Alcohol use: No  . Drug use: No    Medications: I have reviewed the patient's current medications. No current facility-administered medications for this encounter.    Current Outpatient Medications  Medication Sig Dispense Refill Last Dose  . albuterol (PROVENTIL HFA;VENTOLIN HFA) 108 (90 Base) MCG/ACT inhaler    Taking  . amLODipine (NORVASC) 5 MG tablet Take 1 tablet by mouth daily.   Taking  . Artificial Tear Ointment (DRY EYES OP) Apply 2 drops to eye daily as needed (dry eyes).   Taking  . aspirin EC 81 MG tablet Take 81 mg by mouth daily.   Taking  . bacitracin 500 UNIT/GM ointment Apply 1 application topically 3 (three) times daily. To umbilicus area 15 g 0 Taking  . BRILINTA 90 MG TABS tablet Take 1 tablet by mouth 2 (two) times daily.   Taking  . clobetasol ointment (TEMOVATE) 2.69 % Apply 1 application topically daily as needed (rash).    Taking  . furosemide (LASIX) 40 MG tablet Take 1 tablet by mouth daily.   Taking  .  gabapentin (NEURONTIN) 300 MG capsule Take 1 capsule by mouth every evening.    Taking  . HYDROcodone-acetaminophen (LORTAB) 7.5-500 MG tablet    Taking  . HYDROcodone-acetaminophen (NORCO/VICODIN) 5-325 MG tablet Take 1 tablet by mouth every 4 (four) hours as needed for moderate pain. 60 tablet 0 Taking  . insulin regular (NOVOLIN R,HUMULIN R) 100 units/mL injection 5 Units 2 (two) times daily before a meal.    Taking  . LEVEMIR FLEXTOUCH 100 UNIT/ML Pen Inject 22 Units as directed daily.   Taking  . losartan (COZAAR) 50 MG tablet Take 1 tablet by mouth daily.   Taking  . meclizine (ANTIVERT) 25 MG tablet Take 25 mg by mouth 3 (three) times daily as needed (veritgo).   Taking  . metoprolol succinate (TOPROL-XL) 25 MG 24 hr tablet Take 1  tablet by mouth daily.   Taking  . omeprazole (PRILOSEC) 20 MG capsule Take 1 capsule by mouth daily.   Taking  . pioglitazone (ACTOS) 30 MG tablet Take 1 tablet by mouth daily.   Taking  . prednisoLONE acetate (PRED FORTE) 1 % ophthalmic suspension    Taking  . Vitamin D, Ergocalciferol, (DRISDOL) 50000 units CAPS capsule Take 50,000 Units by mouth every 7 (seven) days.   Taking   Allergies: Allergies  Allergen Reactions  . Coconut Oil Nausea Only  . Statins Other (See Comments)    "Attacks the muscles"  . Tomato Hives  . Clopidogrel Rash  . Contrast Media [Iodinated Diagnostic Agents] Rash  . Orange Oil Rash    ROS:  A comprehensive review of systems was negative except for: Hematologic/lymphatic: positive for bleeding and from the umbilical cancer metastasis  Blood pressure (!) 164/59, pulse 72, temperature 98.3 F (36.8 C), resp. rate (!) 24, height 4\' 9"  (1.448 m), weight 183 lb (83 kg), SpO2 93 %. Physical Exam  Constitutional: She is oriented to person, place, and time and well-developed, well-nourished, and in no distress.  HENT:  Head: Normocephalic.  Eyes: Pupils are equal, round, and reactive to light.  Neck: Normal range of motion.  Cardiovascular: Normal rate.  Pulmonary/Chest: Effort normal.  Abdominal: Soft. She exhibits no distension. There is tenderness.  Large ulcerated, 10cm mass with active oozing from the necrotic friable tissue  Musculoskeletal: Normal range of motion.  Neurological: She is alert and oriented to person, place, and time.  Skin: Skin is warm and dry.  Psychiatric: Mood, memory, affect and judgment normal.  Vitals reviewed.     Results: Results for orders placed or performed during the hospital encounter of 03/08/17 (from the past 48 hour(s))  Hemoglobin and hematocrit, blood     Status: Abnormal   Collection Time: 03/09/17 12:21 AM  Result Value Ref Range   Hemoglobin 6.4 (LL) 12.0 - 15.0 g/dL    Comment: REPEATED TO  VERIFY CRITICAL RESULT CALLED TO, READ BACK BY AND VERIFIED WITH: LAW,Y @ 0100 ON 2.1.19 BY BOWMAN,L    HCT 21.7 (L) 36.0 - 46.0 %  Prepare RBC     Status: None   Collection Time: 03/09/17  1:48 AM  Result Value Ref Range   Order Confirmation ORDER PROCESSED BY BLOOD BANK   Type and screen [facility]     Status: None (Preliminary result)   Collection Time: 03/09/17  1:48 AM  Result Value Ref Range   ABO/RH(D) A POS    Antibody Screen NEG    Sample Expiration 03/12/2017    Unit Number [id number]    Blood Component  Type RED CELLS,LR    Unit division 00    Status of Unit ISSUED    Transfusion Status OK TO TRANSFUSE    Crossmatch Result      Compatible Performed at [facility], [address] [place], [place] [address]    Unit Number [id number]    Blood Component Type RED CELLS,LR    Unit division 00    Status of Unit ISSUED    Transfusion Status OK TO TRANSFUSE    Crossmatch Result Compatible     Personally reviewed the OSH CT and the PET scan from 02/2016 and 04/2016, pancreatic mass at the tail measuring 2X5MW, and umbilical mass 4X3KG  Assessment & Plan:  Nadean Montanaro is a 84 y.o. female with metastatic pancreatic cancer with a bleeding ulcerated mass at the umbilicus from the evolution of her sister mary joseph node. I have applied 3g of Arista and Surgicel to the area, and held pressure, but this did not keep the area from continuing to ooze which is not unexpected due to the nature of cancerous tissue.  I have applied gauze, and ABD over the surgicel and Arista, and foam tape to create a pressure dressing.  In addition, a sandbag was placed over the dressing and should stay in place for 10 minutes (communicated with the RN.)   -If the dressing is removed it will re-bleed, would leave in place and not attempt redressing wound if possible. -Discussed with the patient and the daughter that there are options for do not hospitalize, and options for inpatient hospice  in [place], as well as the possibility that any palliative radiation, if offered, may not help or will only help for a limited amount of time.   -The patient and niece understand, and the niece was adamant about not wanting the patient to bleed out at home. I was upfront with the niece that this could still happen despite best efforts due to the nature of the disease.  I also discussed that the patient could bleed internally from erosion of the cancer into blood vessels around the spleen.  -The ED is going to get in touch with radiation oncology to see if the patient is a candidate for palliative radiation, at this time I am unsure.  -No surgical intervention is available.   All questions were answered to the satisfaction of the patient and family.     Virl Cagey 03/09/2017, 9:19 AM

## 2017-03-09 NOTE — ED Notes (Signed)
Called [facility] for transport to [facility] as requested by CN to get Pt there before 3p for her Radiation TX.

## 2017-03-09 NOTE — ED Notes (Signed)
4x4 dressing change, soaked with blood over last hour, surgeon at the bedside

## 2017-03-09 NOTE — Progress Notes (Signed)
[facility]  Full Consult Note to Follow.  Patient with metastatic pancreatic tail adenocarcinoma with a sister [name] node that has become a ulcerated bleeding mass in the umbilical. She first presented 03/02/2016 and was diagnosed later in the winter with biopsies.    Came in with bleeding from the umbilical mass. Hgh low. She has home hospice and is DNR. She and her medical power of attorney, her niece, are aware that there are limited options and that any treatment is palliative.  I applied Arista and Surgicel to the wound. Gauze and ABD, and Foam dressing.  I applied a sandbag over the dressing, leave in place for about 10 minutes.  Would not remove the dressing due to guarantee that it will rebleed.  Would see if radiation oncology thinks patient candidate for any palliative radiation.  Curlene Labrum, MD [facility] [address] [place], [place] [address] [phone] (office)

## 2017-03-09 NOTE — H&P (Addendum)
History and Physical  Kelsey Palmer FUX:[id number] DOB: [birth year] DOA: 03/08/2017  Referring physician: Sabra Heck PCP: [name], NP   Chief Complaint: Bleeding from [place]  HPI: Kelsey Palmer is a 84 y.o. female on home hospice with PMH of metastatic pancreatic tail adenocarcinoma with a Sister [name] node involving the umbilicus as an ulcerated bleeding mass.  The patient chose not to treat the cancer with chemotherapy and elected for home hospice care.  Up until recently she has not been having symptoms from the pancreatic cancer.  The constant bleeding from the abdominal mass prompted her to come to the ED.  Her hemoglobin was noted to be low at 6.4 on arrival and she was given 2 units of PRBCs.  The patient was seen by general surgery to evaluate the abdominal mass and she was fitted with a [place] and Surgicel to the wound with gauze and ABD pads and foam dressings to help stop the bleeding.  They recommended radiation oncology consult for palliative radiation to the area as this is a wound that is high risk for rebleeding.  The ED physician spoke with Dr. Lisbeth Renshaw the radiation oncologist who agreed to see the patient in consultation for possible palliative radiation to the area today.  The patient is going to be transferred to [facility] long hospital so that she should be evaluated by radiation oncology and possibly receive radiation treatments.  Review of Systems: All systems reviewed and apart from history of presenting illness, are negative.  Past Medical History:  Diagnosis Date  . Adenocarcinoma of pancreas ([place]) 04/05/2016  . Anemia   . CAD S/P percutaneous coronary angioplasty 12/15- 1/16   OM3, staged LAD/RCA  . Chronic renal disease, stage III ([place])   . Contrast media allergy Dec 2015  . Dyslipidemia    statin intol  . GERD (gastroesophageal reflux disease)   . Hypertension   . Ischemic cardiomyopathy Dec 2015   EF 45-50%  . NSTEMI (non-ST elevated myocardial  infarction) ([place]) [place] 2015   cath done in [place]  . Obesity (BMI 30-39.9)   . Pancreatic cancer ([place])   . Shingles   . Type 2 diabetes mellitus with renal manifestations [facility])    Past Surgical History:  Procedure Laterality Date  . ABDOMINAL HYSTERECTOMY    . CARDIAC CATHETERIZATION  Dec 2015/Jan 2016   First procedure had allergic reaction to dye, had to do again in Jan 2016  . CHOLECYSTECTOMY     Social History:  reports that  has never smoked. she has never used smokeless tobacco. She reports that she does not drink alcohol or use drugs.  Allergies  Allergen Reactions  . Coconut Oil Nausea Only  . Statins Other (See Comments)    "Attacks the muscles"  . Tomato Hives  . Clopidogrel Rash  . Contrast Media [Iodinated Diagnostic Agents] Rash  . Orange Oil Rash    Family History  Problem Relation Age of Onset  . Colon cancer Neg Hx     Prior to Admission medications   Medication Sig Start Date End Date Taking? Authorizing Provider  albuterol (PROVENTIL HFA;VENTOLIN HFA) 108 (90 Base) MCG/ACT inhaler  04/26/09   [provider]  amLODipine (NORVASC) 5 MG tablet Take 1 tablet by mouth daily. 06/22/14   [provider]  Artificial Tear Ointment (DRY EYES OP) Apply 2 drops to eye daily as needed (dry eyes).    [provider]  aspirin EC 81 MG tablet Take 81 mg by mouth  daily.    [provider]  bacitracin 500 UNIT/GM ointment Apply 1 application topically 3 (three) times daily. To umbilicus area 2/50/53   Baird Cancer, PA-C  BRILINTA 90 MG TABS tablet Take 1 tablet by mouth 2 (two) times daily. 08/20/14   [provider]  clobetasol ointment (TEMOVATE) 9.76 % Apply 1 application topically daily as needed (rash).  07/14/14   [provider]  furosemide (LASIX) 40 MG tablet Take 1 tablet by mouth daily. 08/20/14   [provider]  gabapentin (NEURONTIN) 300 MG capsule Take 1 capsule by mouth every evening.  08/04/14    [provider]  HYDROcodone-acetaminophen (LORTAB) 7.5-500 MG tablet  06/25/09   [provider]  HYDROcodone-acetaminophen (NORCO/VICODIN) 5-325 MG tablet Take 1 tablet by mouth every 4 (four) hours as needed for moderate pain. 03/02/16   Penland, Kelby Fam, MD  insulin regular (NOVOLIN R,HUMULIN R) 100 units/mL injection 5 Units 2 (two) times daily before a meal.  03/17/09   [provider]  LEVEMIR FLEXTOUCH 100 UNIT/ML Pen Inject 22 Units as directed daily. 06/22/14   [provider]  losartan (COZAAR) 50 MG tablet Take 1 tablet by mouth daily. 06/22/14   [provider]  meclizine (ANTIVERT) 25 MG tablet Take 25 mg by mouth 3 (three) times daily as needed (veritgo).    [provider]  metoprolol succinate (TOPROL-XL) 25 MG 24 hr tablet Take 1 tablet by mouth daily. 07/13/14   [provider]  omeprazole (PRILOSEC) 20 MG capsule Take 1 capsule by mouth daily. 08/20/14   [provider]  pioglitazone (ACTOS) 30 MG tablet Take 1 tablet by mouth daily. 08/06/14   [provider]  prednisoLONE acetate (PRED FORTE) 1 % ophthalmic suspension  11/30/09   [provider]  Vitamin D, Ergocalciferol, (DRISDOL) 50000 units CAPS capsule Take 50,000 Units by mouth every 7 (seven) days.    [provider]   Physical Exam: Vitals:   03/09/17 0815 03/09/17 0830 03/09/17 0845 03/09/17 0900  BP: (!) 148/48 (!) 162/57 (!) 144/53 (!) 164/59  Pulse: 72     Resp: 20 (!) 22 20 (!) 24  Temp:      TempSrc:      SpO2: 93%     Weight:      Height:         General exam: Moderately built and nourished patient, lying comfortably supine on the gurney in no obvious distress.  Head, eyes and ENT: Nontraumatic and normocephalic. Pupils equally reacting to light and accommodation. Oral mucosa moist.  Neck: Supple. No JVD, carotid bruit or thyromegaly.  Lymphatics: No lymphadenopathy.  Respiratory system: Clear to  auscultation. No increased work of breathing.  Cardiovascular system: S1 and S2 heard. No JVD, murmurs, gallops, clicks or pedal edema.  Gastrointestinal system: Abdomen is nondistended, soft and large bleeding ulcerated mass seen. Normal bowel sounds heard.       Central nervous system: Alert and oriented. No focal neurological deficits.  Extremities: Symmetric 5 x 5 power. Peripheral pulses symmetrically felt.   Skin: No rashes or acute findings.  Musculoskeletal system: Negative exam.  Psychiatry: Pleasant and cooperative.  Labs on Admission:  Basic Metabolic Panel: No results for input(s): NA, K, CL, CO2, GLUCOSE, BUN, CREATININE, CALCIUM, MG, PHOS in the last 168 hours. Liver Function Tests: No results for input(s): AST, ALT, ALKPHOS, BILITOT, PROT, ALBUMIN in the last 168 hours. No results for input(s): LIPASE, AMYLASE in the last 168 hours. No  results for input(s): AMMONIA in the last 168 hours. CBC: Recent Labs  Lab 03/09/17 0021  HGB 6.4*  HCT 21.7*   Cardiac Enzymes: No results for input(s): CKTOTAL, CKMB, CKMBINDEX, TROPONINI in the last 168 hours.  BNP (last 3 results) No results for input(s): PROBNP in the last 8760 hours. CBG: No results for input(s): GLUCAP in the last 168 hours.  Radiological Exams on Admission: No results found.  Assessment/Plan Active Problems:   Acute blood loss anemia   Essential hypertension   Anemia, iron deficiency   Chronic renal insufficiency, stage III (moderate) (HCC)   GERD (gastroesophageal reflux disease)   CAD (coronary artery disease)   Adenocarcinoma of pancreas ([place])   Abdominal mass   Hospice care patient  1. Acute blood loss anemia - Hg down to 6.4, transfused 2 units of PRBCs.  General surgery was able to place an abdominal dressing over the wound to stop the bleeding however if that is removed there is a very high likelihood that she will start rebleeding.  Repeat CBC in the morning. 2. Adenocarcinoma of  the pancreas with metastatic abdominal mass-the patient would like to consider palliative radiation treatment to the mass to stop the bleeding if possible however they do understand that this likely would only be a temporary measure and that the mass will likely re-bleed at some point in the future.  They declined residential hospice placement at this time.  Dr. Lisbeth Renshaw will consult when patient arrives at [facility]. 3. CAD - holding her home aspirin and brilinta at this time.   DVT Prophylaxis: SCDs Code Status: DNR  Family Communication: niece at bedside  Disposition Plan: home with hospice   Time spent: 19 mins  Irwin Brakeman, MD [facility] Pager [phone]  If 7PM-7AM, please contact night-coverage [email or web address] Password [facility] 03/09/2017, 10:23 AM

## 2017-03-09 NOTE — Progress Notes (Addendum)
Radiation Oncology         [phone] ________________________________  Name: Kelsey Palmer MRN: [id number]  Date: 03/09/2017  DOB: 03/24/1931  VZ:DGLOVFI, Laurita Quint, NP  Murlean Iba, MD     REFERRING PHYSICIAN: Murlean Iba, MD   DIAGNOSIS: The encounter diagnosis was Secondary malignant neoplasm of skin ([place]).   HISTORY OF PRESENT ILLNESS::Kelsey Palmer is a 84 y.o. female who is seen for an initial consultation visit regarding the patient's diagnosis of pancreatic cancer.  The patient has decided in the past not to pursue aggressive treatment but has noted this admission to have a progressive bleeding mass in the umbilical region.  They are interested in short-term palliative measures to try to improve this and I have been asked to see the patient for consideration of radiation treatment.  The patient therefore has been brought to [facility] for this purpose.  I discussed with the patient her current diagnosis and the potential role of radiation treatment.    PREVIOUS RADIATION THERAPY: No   PAST MEDICAL HISTORY:  has a past medical history of Adenocarcinoma of pancreas ([place]) (04/05/2016), Anemia, CAD S/P percutaneous coronary angioplasty (12/15- 1/16), Chronic renal disease, stage III ([place]), Contrast media allergy (Dec 2015), Dyslipidemia, GERD (gastroesophageal reflux disease), Hypertension, Ischemic cardiomyopathy (Dec 2015), NSTEMI (non-ST elevated myocardial infarction) ([place]) (DEC 2015), Obesity (BMI 30-39.9), Pancreatic cancer ([place]), Shingles, and Type 2 diabetes mellitus with renal manifestations ([place]).     PAST SURGICAL HISTORY: Past Surgical History:  Procedure Laterality Date  . ABDOMINAL HYSTERECTOMY    . CARDIAC CATHETERIZATION  Dec 2015/Jan 2016   First procedure had allergic reaction to dye, had to do again in Jan 2016  . CHOLECYSTECTOMY       FAMILY HISTORY: family history is not on file.   SOCIAL HISTORY:  reports that  has never smoked. she  has never used smokeless tobacco. She reports that she does not drink alcohol or use drugs.   ALLERGIES: Coconut oil; Statins; Tomato; Clopidogrel; Contrast media [iodinated diagnostic agents]; and Orange oil   MEDICATIONS:  Current Outpatient Medications  Medication Sig Dispense Refill  . acetaminophen (TYLENOL) 325 MG tablet Take 2 tablets (650 mg total) by mouth every 6 (six) hours as needed for mild pain (or Fever >/= 101). 30 tablet 0  . albuterol (PROVENTIL HFA;VENTOLIN HFA) 108 (90 Base) MCG/ACT inhaler     . Artificial Tear Ointment (DRY EYES OP) Apply 2 drops to eye daily as needed (dry eyes).    . clobetasol ointment (TEMOVATE) 4.33 % Apply 1 application topically daily as needed (rash).     . furosemide (LASIX) 40 MG tablet Take 1 tablet by mouth daily.    Marland Kitchen gabapentin (NEURONTIN) 300 MG capsule Take 1 capsule by mouth every evening.     Marland Kitchen HYDROcodone-acetaminophen (NORCO/VICODIN) 5-325 MG tablet Take 1 tablet by mouth every 4 (four) hours as needed for moderate pain. 60 tablet 0  . LEVEMIR FLEXTOUCH 100 UNIT/ML Pen Inject 23 Units as directed daily.     Marland Kitchen losartan (COZAAR) 100 MG tablet Take 1 tablet by mouth daily.    . meclizine (ANTIVERT) 25 MG tablet Take 25 mg by mouth 3 (three) times daily as needed (veritgo).    . metoprolol succinate (TOPROL-XL) 50 MG 24 hr tablet Take 1 tablet by mouth daily.    Marland Kitchen omeprazole (PRILOSEC) 20 MG capsule Take 1 capsule by mouth daily.    . pioglitazone (ACTOS) 30 MG tablet Take 1 tablet by mouth  daily.    . Vitamin D, Ergocalciferol, (DRISDOL) 50000 units CAPS capsule Take 50,000 Units by mouth every 7 (seven) days.     No current facility-administered medications for this encounter.      REVIEW OF SYSTEMS:  A 15 point review of systems is documented in the electronic medical record. This was obtained by the nursing staff. However, I reviewed this with the patient to discuss relevant findings and make appropriate changes.  Pertinent items  are noted in HPI.    PHYSICAL EXAM:  vitals were not taken for this visit.   ECOG = 3  0 - Asymptomatic (Fully active, able to carry on all predisease activities without restriction)  1 - Symptomatic but completely ambulatory (Restricted in physically strenuous activity but ambulatory and able to carry out work of a light or sedentary nature. For example, light housework, office work)  2 - Symptomatic, <50% in bed during the day (Ambulatory and capable of all self care but unable to carry out any work activities. Up and about more than 50% of waking hours)  3 - Symptomatic, >50% in bed, but not bedbound (Capable of only limited self-care, confined to bed or chair 50% or more of waking hours)  4 - Bedbound (Completely disabled. Cannot carry on any self-care. Totally confined to bed or chair)  5 - Death   Eustace Pen MM, Creech RH, Tormey DC, et al. [phone]). "Toxicity and response criteria of the [facility]". [place] Oncol. 5 (6): 649-55  Abdominal superficial mass present which is bandaged.   LABORATORY DATA:  Lab Results  Component Value Date   WBC 8.3 03/10/2017   HGB 8.4 (L) 03/10/2017   HCT 25.4 (L) 03/10/2017   MCV 92.4 03/10/2017   PLT 190 03/10/2017   Lab Results  Component Value Date   NA 134 (L) 04/05/2016   K 4.2 04/05/2016   CL 101 04/05/2016   CO2 28 04/05/2016   Lab Results  Component Value Date   ALT 16 04/05/2016   AST 21 04/05/2016   ALKPHOS 74 04/05/2016   BILITOT 0.3 04/05/2016      RADIOGRAPHY: Dg Wrist Complete Left  Result Date: 03/13/2017 CLINICAL DATA:  84 year old female fell 3 days ago getting out of car. Wrist pain. Hospice for pancreatic cancer. Initial encounter. EXAM: LEFT WRIST - COMPLETE 3+ VIEW COMPARISON:  None. FINDINGS: Impaction fracture distal left radius with intra-articular extension with neutral to slight dorsal positioning of the distal radial articular surface. Ulna styloid fracture. Vascular  calcifications. IMPRESSION: Impaction fracture distal left radius with intra-articular extension with neutral to slight dorsal positioning of the distal radial articular surface. Ulna styloid fracture. Electronically Signed   By: Genia Del M.D.   On: 03/13/2017 13:33       IMPRESSION:  The patient is appropriate for palliative radiation treatment to the superficial anterior abdominal mass.  I discussed the possible benefit as well as possible side effects with the patient.  I believe a short course of treatment may be reasonable for the patient and we discussed one treatment to a dose of 8 gray.   PLAN: Patient will proceed with simulation today and we will also complete her 1 fraction of radiation today as well.    ________________________________   Jodelle Gross, MD, PhD   **Disclaimer: This note was dictated with voice recognition software. Similar sounding words can inadvertently be transcribed and this note may contain transcription errors which may not have been corrected upon publication  of note.**

## 2017-03-09 NOTE — ED Notes (Signed)
EMS here for transport

## 2017-03-09 NOTE — ED Notes (Signed)
Dr Constance Haw, applied surgical starches and pressure dressing with foam tape, and 2 lb sand bag. Explain all options to POA and pt. They understand, pt not wanting any treatment. POA continues to question pt about options

## 2017-03-09 NOTE — Progress Notes (Signed)
  Radiation Oncology         [phone] ________________________________  Name: Kelsey Palmer MRN: [id number]  Date: 03/09/2017  DOB: 1931-07-27  SIMULATION AND TREATMENT PLANNING NOTE  DIAGNOSIS:     ICD-10-CM   1. Secondary malignant neoplasm of skin ([place]) C79.2      Site: Abdomen anteriorly  NARRATIVE:  The patient was brought to the [place].  Identity was confirmed.  All relevant records and images related to the planned course of therapy were reviewed.   Written consent to proceed with treatment was confirmed which was freely given after reviewing the details related to the planned course of therapy had been reviewed with the patient.  Then, the patient was set-up in a stable reproducible  supine position for radiation therapy.  CT images were obtained.  Surface markings were placed.     The CT images were loaded into the planning software.  Then the target and avoidance structures were contoured.  Treatment planning then occurred.  The radiation prescription was entered and confirmed.  A total of 3 complex treatment devices were fabricated which relate to the designed radiation treatment fields. Each of these customized fields/ complex treatment devices will be used on a daily basis during the radiation course. I have requested : Isodose Plan.   PLAN:  The patient will receive 8 Gy in 1 fractions.  The patient will complete this treatment today  ________________________________   Jodelle Gross, MD, PhD

## 2017-03-09 NOTE — Telephone Encounter (Signed)
Called [facility] ED and spoke to [place], [place].  She said patient will be transferred to [facility].  She is just waiting for [facility] to give report and said she was told the truck was on its way.  Also called [facility] [place], spoke to [place], Therapist, sports and advised that patient could have CT SIM today if she arrives before 3 PM.  She verbalized understanding.

## 2017-03-10 ENCOUNTER — Other Ambulatory Visit: Payer: Self-pay

## 2017-03-10 DIAGNOSIS — D62 Acute posthemorrhagic anemia: Secondary | ICD-10-CM | POA: Diagnosis not present

## 2017-03-10 LAB — TYPE AND SCREEN
ABO/RH(D): A POS
ANTIBODY SCREEN: NEGATIVE
Unit division: 0
Unit division: 0

## 2017-03-10 LAB — BPAM RBC
BLOOD PRODUCT EXPIRATION DATE: 201902212359
Blood Product Expiration Date: 201902212359
ISSUE DATE / TIME: 201902010359
ISSUE DATE / TIME: 201902010726
UNIT TYPE AND RH: 6200
Unit Type and Rh: 6200

## 2017-03-10 LAB — CBC
HEMATOCRIT: 25.4 % — AB (ref 36.0–46.0)
Hemoglobin: 8.4 g/dL — ABNORMAL LOW (ref 12.0–15.0)
MCH: 30.5 pg (ref 26.0–34.0)
MCHC: 33.1 g/dL (ref 30.0–36.0)
MCV: 92.4 fL (ref 78.0–100.0)
PLATELETS: 190 10*3/uL (ref 150–400)
RBC: 2.75 MIL/uL — ABNORMAL LOW (ref 3.87–5.11)
RDW: 15.6 % — AB (ref 11.5–15.5)
WBC: 8.3 10*3/uL (ref 4.0–10.5)

## 2017-03-10 MED ORDER — ACETAMINOPHEN 325 MG PO TABS: 650.0000 mg | ORAL_TABLET | Freq: Four times a day (QID) | ORAL | 0 refills | Status: AC | PRN

## 2017-03-10 NOTE — Discharge Summary (Addendum)
Physician Discharge Summary  Kelsey Palmer FOY:[id number] DOB: 01/26/1932 DOA: 03/08/2017  PCP: [name], NP  Admit date: 03/08/2017 Discharge date: 03/10/2017  Recommendations for Outpatient Follow-up:  Would hold of on aspirin and brilinta for about 5-7 days to make sure no further bleeding from the wound Follow up with your PCP in next 3-5 days to make sure symptoms are stable  AS far wound care goes, keep dry, if there is any bleeding please address with physician (PCP or hospice MD).  Discharge Diagnoses:  Active Problems:   Essential hypertension   Acute blood loss anemia   Anemia, iron deficiency   Chronic renal insufficiency, stage III (moderate) (HCC)   GERD (gastroesophageal reflux disease)   CAD (coronary artery disease)   Adenocarcinoma of pancreas (HCC)   Abdominal mass   Hospice care patient    Discharge Condition: stable   Diet recommendation: as tolerated   History of present illness:   Per HPI "84 y.o. female on home hospice with PMH of metastatic pancreatic tail adenocarcinoma with a Sister Kelsey Palmer node involving the umbilicus as an ulcerated bleeding mass.  The patient chose not to treat the cancer with chemotherapy and elected for home hospice care.  Up until recently she has not been having symptoms from the pancreatic cancer.  The constant bleeding from the abdominal mass prompted her to come to the ED.  Her hemoglobin was noted to be low at 6.4 on arrival and she was given 2 units of PRBCs.  The patient was seen by general surgery to evaluate the abdominal mass and she was fitted with a Arista and Surgicel to the wound with gauze and ABD pads and foam dressings to help stop the bleeding.  They recommended radiation oncology consult for palliative radiation to the area as this is a wound that is high risk for rebleeding.  The ED physician spoke with Dr. Lisbeth Renshaw the radiation oncologist who agreed to see the patient in consultation for possible palliative  radiation to the area today.  The patient is going to be transferred to [facility] so that she should be evaluated by radiation oncology and possibly receive radiation treatments."    Hospital Course:   Active Problems:   Acute blood loss anemia - S/p 2 U PRBC transfusion - Hgb stable post transfusion - No bleeding this am    Chronic renal insufficiency, stage III (moderate) (HCC) - Outpt follow up    CAD (coronary artery disease) - Hold of on brilinta and asa for 5-7 days to make sure no further bleeding from the wound     Adenocarcinoma of pancreas (HCC) / Abdominal mass - Rad to wound 2/1 - No further bleed - Hgb stable    Hospice care patient    Signed:  Leisa Lenz, MD  [facility] 03/10/2017, 9:25 AM  Pager #: [phone]  Time spent in minutes: more than 30 minutes    Discharge Exam: Vitals:   03/09/17 2023 03/10/17 0500  BP: (!) 151/57 (!) 128/46  Pulse: 90 75  Resp: 18 16  Temp: 98.4 F (36.9 C) 99 F (37.2 C)  SpO2: 95% 92%   Vitals:   03/09/17 1400 03/09/17 1901 03/09/17 2023 03/10/17 0500  BP: (!) 159/56 (!) 178/48 (!) 151/57 (!) 128/46  Pulse: 74 79 90 75  Resp: (!) 26 20 18 16   Temp:  98.3 F (36.8 C) 98.4 F (36.9 C) 99 F (37.2 C)  TempSrc:  Oral Oral Oral  SpO2: 100%  98% 95% 92%  Weight:  86.1 kg (189 lb 13.1 oz)    Height:        General: Pt is alert, follows commands appropriately, not in acute distress Cardiovascular: Regular rate and rhythm, S1/S2 + Respiratory: Clear to auscultation bilaterally, no wheezing, no crackles, no rhonchi Abdominal: Soft, abd wound (+) Extremities: no edema, no cyanosis, pulses palpable bilaterally DP and PT Neuro: Grossly nonfocal  Discharge Instructions  Discharge Instructions    Call MD for:  persistant nausea and vomiting   Complete by:  As directed    Call MD for:  redness, tenderness, or signs of infection (pain, swelling, redness, odor or green/yellow discharge around  incision site)   Complete by:  As directed    Call MD for:  severe uncontrolled pain   Complete by:  As directed    Diet - low sodium heart healthy   Complete by:  As directed    Discharge instructions   Complete by:  As directed    Would hold of on aspirin and brilinta for about 5-7 days to make sure no further bleeding from the wound Follow up with your PCP in next 3-5 days to make sure symptoms are stable   Increase activity slowly   Complete by:  As directed      Allergies as of 03/10/2017      Reactions   Coconut Oil Nausea Only   Statins Other (See Comments)   "Attacks the muscles"   Tomato Hives   Clopidogrel Rash   Contrast Media [iodinated Diagnostic Agents] Rash   Orange Oil Rash      Medication List    STOP taking these medications   aspirin EC 81 MG tablet   bacitracin 500 UNIT/GM ointment   BRILINTA 90 MG Tabs tablet Generic drug:  ticagrelor     TAKE these medications   acetaminophen 325 MG tablet Commonly known as:  TYLENOL Take 2 tablets (650 mg total) by mouth every 6 (six) hours as needed for mild pain (or Fever >/= 101).   albuterol 108 (90 Base) MCG/ACT inhaler Commonly known as:  PROVENTIL HFA;VENTOLIN HFA   clobetasol ointment 0.05 % Commonly known as:  TEMOVATE Apply 1 application topically daily as needed (rash).   DRY EYES OP Apply 2 drops to eye daily as needed (dry eyes).   furosemide 40 MG tablet Commonly known as:  LASIX Take 1 tablet by mouth daily.   gabapentin 300 MG capsule Commonly known as:  NEURONTIN Take 1 capsule by mouth every evening.   HYDROcodone-acetaminophen 5-325 MG tablet Commonly known as:  NORCO/VICODIN Take 1 tablet by mouth every 4 (four) hours as needed for moderate pain.   LEVEMIR FLEXTOUCH 100 UNIT/ML Pen Generic drug:  Insulin Detemir Inject 23 Units as directed daily.   losartan 100 MG tablet Commonly known as:  COZAAR Take 1 tablet by mouth daily.   meclizine 25 MG tablet Commonly known as:   ANTIVERT Take 25 mg by mouth 3 (three) times daily as needed (veritgo).   metoprolol succinate 50 MG 24 hr tablet Commonly known as:  TOPROL-XL Take 1 tablet by mouth daily.   omeprazole 20 MG capsule Commonly known as:  PRILOSEC Take 1 capsule by mouth daily.   pioglitazone 30 MG tablet Commonly known as:  ACTOS Take 1 tablet by mouth daily.   Vitamin D (Ergocalciferol) 50000 units Caps capsule Commonly known as:  DRISDOL Take 50,000 Units by mouth every 7 (seven) days.  Follow-up Information    [name], NP Follow up.   Specialty:  Nurse Practitioner Why:  As needed Contact information: [address] [place] [address] [phone]        Schedule an appointment as soon as possible for a visit with Twana First, MD.   Specialty:  Oncology Why:  discuss whether radiation treatment would be helpful Contact information: [place] [address] [phone]        [name], NP. Schedule an appointment as soon as possible for a visit.   Specialty:  Nurse Practitioner Contact information: [address] [place] [address] [phone]            The results of significant diagnostics from this hospitalization (including imaging, microbiology, ancillary and laboratory) are listed below for reference.    Significant Diagnostic Studies: No results found.  Microbiology: No results found for this or any previous visit (from the past 240 hour(s)).   Labs: Basic Metabolic Panel: No results for input(s): NA, K, CL, CO2, GLUCOSE, BUN, CREATININE, CALCIUM, MG, PHOS in the last 168 hours. Liver Function Tests: No results for input(s): AST, ALT, ALKPHOS, BILITOT, PROT, ALBUMIN in the last 168 hours. No results for input(s): LIPASE, AMYLASE in the last 168 hours. No results for input(s): AMMONIA in the last 168 hours. CBC: Recent Labs  Lab 03/09/17 0021 03/10/17 0340  WBC  --  8.3  HGB 6.4* 8.4*  HCT 21.7* 25.4*   MCV  --  92.4  PLT  --  190   Cardiac Enzymes: No results for input(s): CKTOTAL, CKMB, CKMBINDEX, TROPONINI in the last 168 hours. BNP: BNP (last 3 results) No results for input(s): BNP in the last 8760 hours.  ProBNP (last 3 results) No results for input(s): PROBNP in the last 8760 hours.  CBG: Recent Labs  Lab 03/09/17 1808  GLUCAP 167*

## 2017-03-10 NOTE — Progress Notes (Signed)
Pt discharged home with Kelsey Palmer (legal guardian). Discharge instructions given and explained to guardian. Kelsey Palmer verbalized understanding.  Discharged from unit via wheelchair.

## 2017-03-10 NOTE — Care Management Note (Signed)
Case Management Note  Patient Details  Name: Kelsey Palmer MRN: [id number] Date of Birth: Oct 23, 1931  Subjective/Objective:   Metastatic Pancreatic Cancer                  Action/Plan: Spoke to pt and niece at bedside. She is followed by [facility]. Faxed dc summary to [place] and [place].   Expected Discharge Date:  03/10/17               Expected Discharge Plan:  Home w Hospice Care  In-House Referral:  NA  Discharge planning Services  CM Consult  Post Acute Care Choice:  Hospice, Resumption of Svcs/PTA Provider Choice offered to:  Adult Children, Patient  DME Arranged:  N/A DME Agency:  NA  HH Arranged:  RN [place] Agency:  [facility]  Status of Service:  Completed, signed off  If discussed at [place] of Stay Meetings, dates discussed:    Additional Comments:  Erenest Rasher, RN 03/10/2017, 1:02 PM

## 2017-03-10 NOTE — Discharge Instructions (Signed)
Hold of on Brilinta and aspirin for 5-7 days until you are sure no further bleeding from the wound

## 2017-03-12 ENCOUNTER — Other Ambulatory Visit: Payer: Self-pay | Admitting: Radiation Oncology

## 2017-03-13 ENCOUNTER — Encounter (HOSPITAL_COMMUNITY): Payer: Self-pay

## 2017-03-13 ENCOUNTER — Emergency Department (HOSPITAL_COMMUNITY)
Admission: EM | Admit: 2017-03-13 | Discharge: 2017-03-13 | Disposition: A | Discharge status: Home / Self Care | Attending: Emergency Medicine | Admitting: Emergency Medicine

## 2017-03-13 ENCOUNTER — Emergency Department (HOSPITAL_COMMUNITY)

## 2017-03-13 DIAGNOSIS — S6992XA Unspecified injury of left wrist, hand and finger(s), initial encounter: Secondary | ICD-10-CM | POA: Diagnosis present

## 2017-03-13 DIAGNOSIS — N183 Chronic kidney disease, stage 3 (moderate): Secondary | ICD-10-CM | POA: Insufficient documentation

## 2017-03-13 DIAGNOSIS — S52532A Colles' fracture of left radius, initial encounter for closed fracture: Secondary | ICD-10-CM | POA: Diagnosis not present

## 2017-03-13 DIAGNOSIS — Y9389 Activity, other specified: Secondary | ICD-10-CM | POA: Diagnosis not present

## 2017-03-13 DIAGNOSIS — W010XXA Fall on same level from slipping, tripping and stumbling without subsequent striking against object, initial encounter: Secondary | ICD-10-CM | POA: Diagnosis not present

## 2017-03-13 DIAGNOSIS — Z79899 Other long term (current) drug therapy: Secondary | ICD-10-CM | POA: Insufficient documentation

## 2017-03-13 DIAGNOSIS — E119 Type 2 diabetes mellitus without complications: Secondary | ICD-10-CM | POA: Insufficient documentation

## 2017-03-13 DIAGNOSIS — Y929 Unspecified place or not applicable: Secondary | ICD-10-CM | POA: Diagnosis not present

## 2017-03-13 DIAGNOSIS — I2510 Atherosclerotic heart disease of native coronary artery without angina pectoris: Secondary | ICD-10-CM | POA: Insufficient documentation

## 2017-03-13 DIAGNOSIS — Y999 Unspecified external cause status: Secondary | ICD-10-CM | POA: Insufficient documentation

## 2017-03-13 DIAGNOSIS — I1310 Hypertensive heart and chronic kidney disease without heart failure, with stage 1 through stage 4 chronic kidney disease, or unspecified chronic kidney disease: Secondary | ICD-10-CM | POA: Insufficient documentation

## 2017-03-13 NOTE — ED Provider Notes (Addendum)
[facility] EMERGENCY DEPARTMENT Provider Note   CSN: [id number] Arrival date & time: 03/13/17  1249     History   Chief Complaint Chief Complaint  Patient presents with  . Fall    HPI Kelsey Palmer is a 84 y.o. female.  Pt presents to the ED today with left wrist pain s/p fall on 2/2.  Pt has a hx of metastatic pancreatic cancer was admitted to the hospital on 1/31 with a continuously bleeding Sr. Kelsey Palmer nodule which dropped her hemoglobin to 6.  She was d/c on 2/2.  She said she stood up, felt dizzy and fell.  She has had pain and swelling to her left wrist since then.      Past Medical History:  Diagnosis Date  . Adenocarcinoma of pancreas ([place]) 04/05/2016  . Anemia   . CAD S/P percutaneous coronary angioplasty 12/15- 1/16   OM3, staged LAD/RCA  . Chronic renal disease, stage III ([place])   . Contrast media allergy Dec 2015  . Dyslipidemia    statin intol  . GERD (gastroesophageal reflux disease)   . Hypertension   . Ischemic cardiomyopathy Dec 2015   EF 45-50%  . NSTEMI (non-ST elevated myocardial infarction) ([place]) [place] 2015   cath done in [place]  . Obesity (BMI 30-39.9)   . Pancreatic cancer ([place])   . Shingles   . Type 2 diabetes mellitus with renal manifestations [facility])     Patient Active Problem List   Diagnosis Date Noted  . Abdominal mass 03/09/2017  . Hospice care patient 03/09/2017  . Secondary malignant neoplasm of skin ([place]) 03/09/2017  . Bleeding from wound   . Adenocarcinoma of pancreas ([place]) 04/05/2016  . Pancreatic adenoma 04/05/2016  . Obesity BMI 37 09/08/2014  . Dyslipidemia-statin intol 09/08/2014  . Chronic renal insufficiency, stage III (moderate) ([place]) 09/08/2014  . Contrast media allergy 09/08/2014  . Type 2 diabetes mellitus with renal manifestations ([place]) 09/07/2014  . Essential hypertension 09/07/2014  . Acute blood loss anemia 09/07/2014  . CAD S/P DES Dec 2015- Jan 2016 09/07/2014  . Anemia, iron deficiency 09/07/2014  .  Heme positive stool   . GERD (gastroesophageal reflux disease) 01/15/2014  . HLD (hyperlipidemia) 01/15/2014  . CAD (coronary artery disease) 01/15/2014  . AKI (acute kidney injury) ([place]) 01/15/2014    Past Surgical History:  Procedure Laterality Date  . ABDOMINAL HYSTERECTOMY    . CARDIAC CATHETERIZATION  Dec 2015/Jan 2016   First procedure had allergic reaction to dye, had to do again in Jan 2016  . CHOLECYSTECTOMY      OB History    No data available       Home Medications    Prior to Admission medications   Medication Sig Start Date End Date Taking? Authorizing Provider  acetaminophen (TYLENOL) 325 MG tablet Take 2 tablets (650 mg total) by mouth every 6 (six) hours as needed for mild pain (or Fever >/= 101). 03/10/17   Robbie Lis, MD  albuterol (PROVENTIL HFA;VENTOLIN HFA) 108 [phone] Base) MCG/ACT inhaler  04/26/09   [provider]  Artificial Tear Ointment (DRY EYES OP) Apply 2 drops to eye daily as needed (dry eyes).    [provider]  clobetasol ointment (TEMOVATE) 5.57 % Apply 1 application topically daily as needed (rash).  07/14/14   [provider]  furosemide (LASIX) 40 MG tablet Take 1 tablet by mouth daily. 08/20/14   [provider]  gabapentin (NEURONTIN) 300 MG capsule Take 1 capsule by mouth  every evening.  08/04/14   [provider]  HYDROcodone-acetaminophen (NORCO/VICODIN) 5-325 MG tablet Take 1 tablet by mouth every 4 (four) hours as needed for moderate pain. 03/02/16   [place], Kelby Fam, MD  LEVEMIR FLEXTOUCH 100 UNIT/ML Pen Inject 23 Units as directed daily.  06/22/14   [provider]  losartan (COZAAR) 100 MG tablet Take 1 tablet by mouth daily. 06/22/14   [provider]  meclizine (ANTIVERT) 25 MG tablet Take 25 mg by mouth 3 (three) times daily as needed (veritgo).    [provider]  metoprolol succinate (TOPROL-XL) 50 MG 24 hr tablet Take 1 tablet by mouth daily. 02/26/17   [provider]  omeprazole (PRILOSEC) 20 MG capsule Take 1 capsule by mouth daily. 08/20/14   [provider]  pioglitazone (ACTOS) 30 MG tablet Take 1 tablet by mouth daily. 08/06/14   [provider]  Vitamin D, Ergocalciferol, (DRISDOL) 50000 units CAPS capsule Take 50,000 Units by mouth every 7 (seven) days.    [provider]    Family History Family History  Problem Relation Age of Onset  . Colon cancer Neg Hx     Social History Social History   Tobacco Use  . Smoking status: Never Smoker  . Smokeless tobacco: Never Used  Substance Use Topics  . Alcohol use: No  . Drug use: No     Allergies   Coconut oil; Statins; Tomato; Clopidogrel; Contrast media [iodinated diagnostic agents]; and Orange oil   Review of Systems Review of Systems  Musculoskeletal:       Left wrist pain/swelling  All other systems reviewed and are negative.    Physical Exam Updated Vital Signs BP (!) 140/49 (BP Location: Right Arm)   Pulse 73   Temp 98 F (36.7 C) (Oral)   Resp 20   Wt 85.7 kg (189 lb)   SpO2 94%   BMI 40.90 kg/m   Physical Exam  Constitutional: She is oriented to person, place, and time. She appears well-developed and well-nourished.  HENT:  Head: Normocephalic and atraumatic.  Right Ear: External ear normal.  Left Ear: External ear normal.  Nose: Nose normal.  Mouth/Throat: Oropharynx is clear and moist.  Eyes: Conjunctivae and EOM are normal. Pupils are equal, round, and reactive to light.  Neck: Normal range of motion. Neck supple.  Cardiovascular: Normal rate, regular rhythm, normal heart sounds and intact distal pulses.  Pulmonary/Chest: Effort normal and breath sounds normal.  Abdominal: Soft. Bowel sounds are normal.  Sr. Kelsey Palmer nodule not visualized as surgeons do not want dressing disturbed so the bleeding does not restart.  Musculoskeletal: She exhibits edema.       Left wrist: She exhibits decreased range of motion, bony  tenderness and swelling.  Neurological: She is alert and oriented to person, place, and time.  Skin: Skin is warm. Capillary refill takes less than 2 seconds.  Psychiatric: She has a normal mood and affect. Her behavior is normal.  Nursing note and vitals reviewed.    ED Treatments / Results  Labs (all labs ordered are listed, but only abnormal results are displayed) Labs Reviewed - No data to display  EKG  EKG Interpretation None       Radiology Dg Wrist Complete Left  Result Date: 03/13/2017 CLINICAL DATA:  84 year old female fell 3 days ago getting out of car. Wrist pain. Hospice for pancreatic cancer. Initial encounter. EXAM: LEFT WRIST - COMPLETE 3+ VIEW COMPARISON:  None. FINDINGS: Impaction fracture distal  left radius with intra-articular extension with neutral to slight dorsal positioning of the distal radial articular surface. Ulna styloid fracture. Vascular calcifications. IMPRESSION: Impaction fracture distal left radius with intra-articular extension with neutral to slight dorsal positioning of the distal radial articular surface. Ulna styloid fracture. Electronically Signed   By: Genia Del M.D.   On: 03/13/2017 13:33    Procedures .Splint Application Date/Time: 09/08/5747 1:51 PM Performed by: Isla Pence, MD Authorized by: Isla Pence, MD   Consent:    Consent obtained:  Verbal   Consent given by:  Patient   Risks discussed:  Discoloration, numbness, pain and swelling   Alternatives discussed:  No treatment Pre-procedure details:    Sensation:  Normal Procedure details:    Laterality:  Left   Location:  Wrist   Wrist:  L wrist   Strapping: no     Splint type:  Sugar tong   Supplies:  Cotton padding and Ortho-Glass Post-procedure details:    Pain:  Improved   Sensation:  Normal   Patient tolerance of procedure:  Tolerated well, no immediate complications   (including critical care time)  Medications Ordered in ED Medications - No data to  display   Initial Impression / Assessment and Plan / ED Course  I have reviewed the triage vital signs and the nursing notes.  Pertinent labs & imaging results that were available during my care of the patient were reviewed by me and considered in my medical decision making (see chart for details).    Pt did not want any pain meds here.  She is on hospice/comfort measures only and has pain meds at home.  Pt's daughter is with her and questions answered.  Pt is stable for d/c.  Final Clinical Impressions(s) / ED Diagnoses   Final diagnoses:  Closed Colles' fracture of left radius, initial encounter    ED Discharge Orders    None       Isla Pence, MD 03/13/17 [place]    Isla Pence, MD 03/13/17 1351

## 2017-03-13 NOTE — ED Triage Notes (Signed)
Ems reports pt is on hospice and is comfort measures only.  Reports she fell Saturday and c/o pain and swelling to left arm.

## 2017-03-13 NOTE — ED Notes (Signed)
[facility] 

## 2017-03-15 NOTE — Progress Notes (Signed)
  Radiation Oncology         [phone] ________________________________  Name: Kelsey Palmer MRN: [id number]  Date: 03/09/2017  DOB: 06/02/1931  End of Treatment Note  Diagnosis:   84 y.o. female with an anterior abdominal metastasis  Indication for treatment::  palliative       Radiation treatment dates:   03/09/2017  Site/dose:   Abdomen anteriorly / 8 Gy in 1 fraction  Narrative: The patient tolerated radiation treatment relatively well without any significant difficulties.  Plan: The patient has completed radiation treatment. The patient will return to radiation oncology clinic for routine followup in one month. I advised the patient to call or return sooner if they have any questions or concerns related to their recovery or treatment. ________________________________  Jodelle Gross, MD, PhD  This document serves as a record of services personally performed by Kyung Rudd, MD. It was created on his behalf by Rae Lips, a trained medical scribe. The creation of this record is based on the scribe's personal observations and the provider's statements to them. This document has been checked and approved by the attending provider.

## 2017-04-06 DEATH — deceased

## 2017-10-17 IMAGING — PT NM PET TUM IMG INITIAL (PI) SKULL BASE T - THIGH
1 of 8 series · 1 of 25 positions shown · non-contrast
Comparison: Outside CT scan from "[REDACTED]" dated the 02/29/2016

CLINICAL DATA: Initial treatment strategy for pancreatic mass and
biopsy-proven adenocarcinoma at the umbilicus.

EXAM:
NUCLEAR MEDICINE PET SKULL BASE TO THIGH
TECHNIQUE: 9.2 mCi F-18 FDG was injected intravenously. Full-ring PET imaging
was performed from the skull base to thigh after the radiotracer. CT
data was obtained and used for attenuation correction and anatomic
localization.
FASTING BLOOD GLUCOSE:  Value: 152 mg/dl

[Series 4: ct sk_thigh 5.0 b31f · axial · 5.0mm · 0.98mm/px · 1 of 182 slices shown]
[im 182/182  brain]
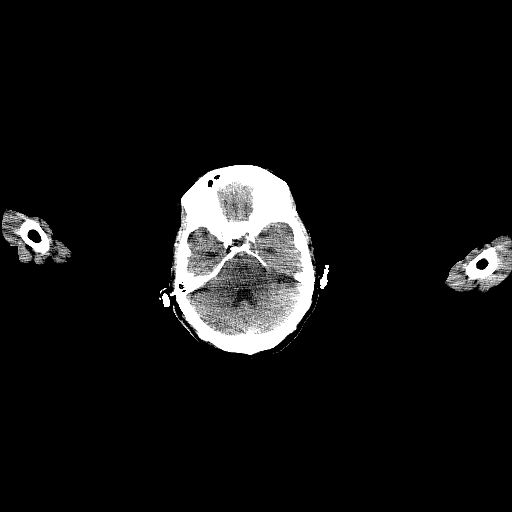

[1 of 25 positions shown; findings below may reference images not displayed]

FINDINGS: NECK

No hypermetabolic lymph nodes in the neck.

CHEST

No hypermetabolic mediastinal or hilar nodes. No suspicious
pulmonary nodules on the CT scan.

Heart size upper normal. Coronary artery atherosclerosis is evident.
Assessment of lung parenchyma limited by breathing motion on this
non breath hold study, but no suspicious pulmonary nodule or mass is
evident.

ABDOMEN/PELVIS

3.2 x 4.4 cm soft tissue mass in the tail the pancreas near the
junction with the body is markedly hypermetabolic with SUV max = 9.

The irregular 5.3 x 6.3 cm lesion at the umbilicus is markedly
hypermetabolic with SUV max = 10.

No hypermetabolic disease identified in the liver. No hypermetabolic
lymphadenopathy in the abdomen and there is no abdominal
lymphadenopathy by CT imaging.

There is abdominal aortic atherosclerosis without aneurysm.
Gallbladder surgically absent. Small hiatal hernia noted

The bladder is nondistended and has anterior irregular wall
thickening. Gas is visible in the bladder lumen.

SKELETON

No focal hypermetabolic activity to suggest skeletal metastasis.
Bone windows reveal no worrisome lytic or sclerotic osseous lesions.
IMPRESSION: 1. Mass in the tail of pancreas near the body tail junction is
markedly hypermetabolic as is the irregular soft tissue mass at the
umbilicus. No other hypermetabolic disease is identified in the
chest, abdomen, or pelvis to suggest other site of primary
malignancy for other metastatic involvement.
2. Irregular bladder wall thickening with gas noted in the bladder
lumen. Features may be related to recent instrumentation, but
bladder infection could have this imaging appearance.
3.  Abdominal Aortic Atherosclerois (AQZFQ-170.0)
